# Patient Record
Sex: Female | Born: 1980 | Race: White | Hispanic: No | Smoking: Never smoker
Health system: Southern US, Community
[De-identification: ages and names within clinical notes are randomized; demographics above are authoritative.]

## PROBLEM LIST (undated history)

## (undated) DIAGNOSIS — F329 Major depressive disorder, single episode, unspecified: Secondary | ICD-10-CM

## (undated) DIAGNOSIS — E669 Obesity, unspecified: Secondary | ICD-10-CM

## (undated) DIAGNOSIS — E785 Hyperlipidemia, unspecified: Secondary | ICD-10-CM

## (undated) DIAGNOSIS — F419 Anxiety disorder, unspecified: Secondary | ICD-10-CM

## (undated) DIAGNOSIS — R87619 Unspecified abnormal cytological findings in specimens from cervix uteri: Secondary | ICD-10-CM

## (undated) DIAGNOSIS — G35 Multiple sclerosis: Secondary | ICD-10-CM

## (undated) DIAGNOSIS — F32A Depression, unspecified: Secondary | ICD-10-CM

## (undated) HISTORY — DX: Depression, unspecified: F32.A

## (undated) HISTORY — DX: Anxiety disorder, unspecified: F41.9

## (undated) HISTORY — DX: Obesity, unspecified: E66.9

## (undated) HISTORY — DX: Major depressive disorder, single episode, unspecified: F32.9

## (undated) HISTORY — DX: Multiple sclerosis: G35

## (undated) HISTORY — DX: Unspecified abnormal cytological findings in specimens from cervix uteri: R87.619

## (undated) HISTORY — PX: BREAST BIOPSY: SHX20

## (undated) HISTORY — DX: Hyperlipidemia, unspecified: E78.5

## (undated) HISTORY — PX: COLPOSCOPY: SHX161

---
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
## 2010-11-16 DIAGNOSIS — G35 Multiple sclerosis: Secondary | ICD-10-CM

## 2010-11-16 HISTORY — DX: Multiple sclerosis: G35

## 2014-09-26 ENCOUNTER — Encounter: Payer: Self-pay | Admitting: Family Medicine

## 2014-09-26 ENCOUNTER — Telehealth: Payer: Self-pay | Admitting: Family Medicine

## 2014-09-26 ENCOUNTER — Ambulatory Visit (INDEPENDENT_AMBULATORY_CARE_PROVIDER_SITE_OTHER): Payer: Managed Care, Other (non HMO) | Admitting: *Deleted

## 2014-09-26 ENCOUNTER — Ambulatory Visit (INDEPENDENT_AMBULATORY_CARE_PROVIDER_SITE_OTHER): Payer: Managed Care, Other (non HMO) | Admitting: Family Medicine

## 2014-09-26 VITALS — BP 123/90 | HR 80 | Temp 98.3°F | Ht 64.0 in | Wt 230.0 lb

## 2014-09-26 DIAGNOSIS — G35 Multiple sclerosis: Secondary | ICD-10-CM | POA: Insufficient documentation

## 2014-09-26 DIAGNOSIS — Z87898 Personal history of other specified conditions: Secondary | ICD-10-CM

## 2014-09-26 DIAGNOSIS — E785 Hyperlipidemia, unspecified: Secondary | ICD-10-CM

## 2014-09-26 DIAGNOSIS — Z23 Encounter for immunization: Secondary | ICD-10-CM

## 2014-09-26 DIAGNOSIS — F418 Other specified anxiety disorders: Secondary | ICD-10-CM

## 2014-09-26 DIAGNOSIS — Z0000 Encounter for general adult medical examination without abnormal findings: Secondary | ICD-10-CM

## 2014-09-26 MED ORDER — SERTRALINE HCL 100 MG PO TABS: 100.0000 mg | ORAL_TABLET | Freq: Every day | ORAL | Status: DC

## 2014-09-26 MED ORDER — DESOGESTREL-ETHINYL ESTRADIOL 0.15-30 MG-MCG PO TABS: 1.0000 | ORAL_TABLET | Freq: Every day | ORAL | Status: DC

## 2014-09-26 NOTE — Telephone Encounter (Signed)
Please call patient, I do not see where her lipid profile was collected during her office visit today. It is ordered, under future orders, and she can have this completed at her earliest convenience when she is fasting. Thanks.

## 2014-09-26 NOTE — Assessment & Plan Note (Addendum)
Patient is up-to-date on her Pap smear, due in 2016 with HPV Testing. Flu Shot administered today Tetanus shot 2014 Pneumonia vaccination 2014 Patient will benefit from early mammography with family history of breast cancer

## 2014-09-26 NOTE — Assessment & Plan Note (Signed)
Lipid profile collected today. Patient has a history of hyperlipidemia. She is not currently medicated.

## 2014-09-26 NOTE — Assessment & Plan Note (Signed)
Neurology referral placed for multiple sclerosis. Patient states that her multiple sclerosis started with some left eye optic neuritis. She follows with ophthalmology regularly. She is prescribed Gilenya 0.5 mg daily by her neurologist in [place]. She does have 2 refills left at this time. If she is unable to get into neurology before she is in need of refills, I will provide this for her just until she gets into neurology.

## 2014-09-26 NOTE — Assessment & Plan Note (Signed)
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 It is clipped for future reference on mammograms.

## 2014-09-26 NOTE — Telephone Encounter (Signed)
Pt had lipid panel at her last MD, and it was less than one year ago so her insurance will not cover it.  I have called and verified this with patient. Travas Schexnayder, Shelby RochesterJessica Dawn

## 2014-09-26 NOTE — Patient Instructions (Signed)
It was a pleasure meeting you today. We will refer you to a psychologist and a neurologist for multiple sclerosis. You will be due for your Pap in 2016. I will call in refills for you for your medications.

## 2014-09-26 NOTE — Progress Notes (Signed)
   Subjective:    Patient ID: Shelby [name], female    DOB: [birth year], 33 y.o.   MRN: [id number]  HPI  New patient establishment: patient presents to family medicine clinic today for new patient establishment. She moved to [place], from [place] in May 2015.  In [place] patients had a gynecologist, Psychiatrist and neurologist.  Patient's current medications are sertraline, trazodone, birth control pill, Gilenya.  Patient has no current complaints and ask for referrals for neurology and psychiatry.  Maintenance: patient is up-to-date with her Pap smear, next will be due in 2016 and will need an HPV completed at that time. Patient needs a flu shot.  Past Medical History  Diagnosis Date  . Depression   . Anxiety   . Multiple sclerosis 2012  . Hyperlipidemia    Past Surgical History  Procedure Laterality Date  . Wisdom tooth extraction N/A 2003    Allergies  Allergen Reactions  . Bactrim [Sulfamethoxazole-Trimethoprim] Anaphylaxis    Family history: Her mother suffered from bipolar/schizophrenia. Her mother also suffered an early death at age 42 from what was presumed to be a cardiac arrest. Her father died at age 7 from throat cancer. Her paternal and maternal grandmothers both had breast cancer. Her grandmother and aunt suffers from multiple sclerosis.  Social:She has lived with her female partner for 11 years.  She is a Museum/gallery exhibitions officer. Her last highest level of education was high school. She does not exercise regularly. She has never used tobacco. She is does not use alcohol or recreational drugs. She is not at risk for sexually transmitted diseases. She feels safe in her relationship. She does not feel depressed.   Review of Systems Per history of present illness    Objective:   Physical Exam BP 123/90 mmHg  Pulse 80  Temp(Src) 98.3 F (36.8 C) (Oral)  Ht 5\' 4"  (1.626 m)  Wt 230 lb (104.327 kg)  BMI 39.46  kg/m2  LMP 08/25/2014 (Approximate) Gen: very pleasant, Caucasian female, no acute distress, nontoxic appearance, well-developed, well-nourished. Obese. HEENT: AT. [place]. Bilateral TM visualized and normal in appearance. Bilateral eyes without injections or icterus. MMM. Bilateral nares without erythema or swelling. Throat without erythema or exudates.  CV: RRR no murmur Chest: CTAB, no wheeze or crackles Abd: Soft. round. NTND. BS present. no Masses palpated.  Ext: No erythema. No edema. 2/4 PT/DP Skin: no rashes, purpura or petechiae.  Neuro:  Normal gait. PERLA. EOMi. Alert. cranial nerves II through XII intact. DTRs equal bilaterally upper and lower extremity Msk: 5/5 bilaterally upper and lower extremity muscle strength Psych: normal affect, dress, demeanor and mood. Normal speech.      Assessment & Plan:

## 2014-09-26 NOTE — Assessment & Plan Note (Addendum)
Patient states she was taking trazodone mostly for sleep more than depression or anxiety and she does not feel it is working at this time. He does not request refills on this medication. She does ask for refills on sertraline, refills prescribed. I psychiatry referral placed, patient is being treated for depression with anxiety, however she has a family history of bipolar and schizophrenia in her mother.Shelby [name]

## 2014-09-27 ENCOUNTER — Other Ambulatory Visit: Payer: Managed Care, Other (non HMO)

## 2014-10-16 ENCOUNTER — Encounter: Payer: Self-pay | Admitting: Neurology

## 2014-10-16 ENCOUNTER — Ambulatory Visit (INDEPENDENT_AMBULATORY_CARE_PROVIDER_SITE_OTHER): Payer: Managed Care, Other (non HMO) | Admitting: Neurology

## 2014-10-16 VITALS — BP 128/78 | HR 81 | Ht 64.0 in | Wt 232.0 lb

## 2014-10-16 DIAGNOSIS — G35 Multiple sclerosis: Secondary | ICD-10-CM

## 2014-10-16 DIAGNOSIS — Z5181 Encounter for therapeutic drug level monitoring: Secondary | ICD-10-CM

## 2014-10-16 NOTE — Patient Instructions (Signed)

## 2014-10-16 NOTE — Progress Notes (Signed)
Reason for visit: Multiple sclerosis  Shelby [name] is a 33 y.o. female  History of present illness:  Shelby [name] is a 33 year old left-handed white female with a history of multiple sclerosis that was diagnosed in 2012. The presenting symptom was a significant left optic neuritis. The patient underwent MRI of the brain and was found to have multiple white matter lesions. The patient underwent a visual evoked response test and a brainstem auditory evoked response test. The patient never underwent a lumbar puncture, but she did have blood work. He was started on Gilenya, and she has been on Gilenya since 2012, with good tolerance. The patient occasionally will have some transient numbness of the right leg, but she has not sustained any significant permanent physical deficits from the multiple sclerosis. The vision in the left eye has essentially normalized. She denies any bowel or bladder control issues, and she denies any balance issues. She does not have weakness of the extremities. She is not having side effects from the Gilenya. She recently moved to this area from [place], and she is seeking another neurologist.  Past Medical History  Diagnosis Date  . Depression   . Anxiety   . Multiple sclerosis 2012  . Hyperlipidemia   . Obesity     Past Surgical History  Procedure Laterality Date  . Wisdom tooth extraction N/A 2003    Family History  Problem Relation Age of Onset  . Bipolar disorder Mother   . Schizophrenia Mother   . Early death Mother   . Heart attack Mother   . Throat cancer Father   . Early death Father   . Multiple sclerosis Maternal Aunt   . Multiple sclerosis Maternal Grandmother   . Cancer Maternal Grandmother   . Breast cancer Paternal Grandmother   . Multiple sclerosis Paternal Grandfather     Social history:  reports that she has never smoked. She has never used smokeless tobacco. She reports that she does not drink alcohol or use illicit  drugs.  Medications:  Current Outpatient Prescriptions on File Prior to Visit  Medication Sig Dispense Refill  . desogestrel-ethinyl estradiol (APRI,EMOQUETTE,SOLIA) 0.15-30 MG-MCG tablet Take 1 tablet by mouth daily. 1 Package 11  . Fingolimod HCl (GILENYA) 0.5 MG CAPS Take 0.5 mg by mouth daily.    . sertraline (ZOLOFT) 100 MG tablet Take 1 tablet (100 mg total) by mouth daily. 30 tablet 7  . traZODone (DESYREL) 50 MG tablet Take 50 mg by mouth at bedtime as needed for sleep.     No current facility-administered medications on file prior to visit.      Allergies  Allergen Reactions  . Bactrim [Sulfamethoxazole-Trimethoprim] Anaphylaxis    ROS:  Out of a complete 14 system review of symptoms, the patient complains only of the following symptoms, and all other reviewed systems are negative.  Fatigue Constipation Easy bruising Headache, numbness Insomnia, sleepiness  Blood pressure 128/78, pulse 81, height 5\' 4"  (1.626 m), weight 232 lb (105.235 kg), last menstrual period 08/25/2014.  Physical Exam  General: The patient is alert and cooperative at the time of the examination. The patient is markedly obese.  Eyes: Pupils are equal, round, and reactive to light. Discs are flat bilaterally.  Neck: The neck is supple, no carotid bruits are noted.  Respiratory: The respiratory examination is clear.  Cardiovascular: The cardiovascular examination reveals a regular rate and rhythm, no obvious murmurs or rubs are noted.  Skin: Extremities are without significant edema.  Neurologic Exam  Mental status: The patient is alert and oriented x 3 at the time of the examination. The patient has apparent normal recent and remote memory, with an apparently normal attention span and concentration ability.  Cranial nerves: Facial symmetry is present. There is good sensation of the face to pinprick and soft touch bilaterally. The strength of the facial muscles and the muscles to head  turning and shoulder shrug are normal bilaterally. Speech is well enunciated, no aphasia or dysarthria is noted. Extraocular movements are full. Visual fields are full. The tongue is midline, and the patient has symmetric elevation of the soft palate. No obvious hearing deficits are noted.  Motor: The motor testing reveals 5 over 5 strength of all 4 extremities. Good symmetric motor tone is noted throughout.  Sensory: Sensory testing is intact to pinprick, soft touch, vibration sensation, and position sense on all 4 extremities. No evidence of extinction is noted.  Coordination: Cerebellar testing reveals good finger-nose-finger and heel-to-shin bilaterally.  Gait and station: Gait is normal. Tandem gait is normal. Romberg is negative. No drift is seen.  Reflexes: Deep tendon reflexes are symmetric and normal bilaterally. Toes are downgoing bilaterally.   Assessment/Plan:  1. Multiple sclerosis  The patient will be sent for blood work for the Gilenya today, and she will have MRI evaluation of the brain with and without gadolinium enhancement. She will follow-up in 6 months. The patient will contact our office if new issues arise. So far, the patient appears to be doing quite well on Gilenya.  Marlan Palau. Keith Cathie Bonnell MD 10/16/2014 4:38 PM  [facility] [address] [place], [place] [id number]  Phone [phone] Fax [phone]

## 2014-10-17 LAB — COMPREHENSIVE METABOLIC PANEL
ALK PHOS: 100 IU/L (ref 39–117)
ALT: 12 IU/L (ref 0–32)
AST: 12 IU/L (ref 0–40)
Albumin/Globulin Ratio: 1.7 (ref 1.1–2.5)
Albumin: 4.2 g/dL (ref 3.5–5.5)
BUN / CREAT RATIO: 17 (ref 8–20)
BUN: 13 mg/dL (ref 6–20)
CALCIUM: 9.5 mg/dL (ref 8.7–10.2)
CO2: 23 mmol/L (ref 18–29)
CREATININE: 0.75 mg/dL (ref 0.57–1.00)
Chloride: 99 mmol/L (ref 97–108)
GFR calc Af Amer: 123 mL/min/{1.73_m2} (ref >59)
GFR calc non Af Amer: 107 mL/min/{1.73_m2} (ref >59)
GLOBULIN, TOTAL: 2.5 g/dL (ref 1.5–4.5)
Glucose: 94 mg/dL (ref 65–99)
Potassium: 4.5 mmol/L (ref 3.5–5.2)
Sodium: 136 mmol/L (ref 134–144)
Total Bilirubin: 0.3 mg/dL (ref 0.0–1.2)
Total Protein: 6.7 g/dL (ref 6.0–8.5)

## 2014-10-17 LAB — CBC WITH DIFFERENTIAL
BASOS: 0 %
Basophils Absolute: 0 10*3/uL (ref 0.0–0.2)
EOS: 1 %
Eosinophils Absolute: 0 10*3/uL (ref 0.0–0.4)
HCT: 39 % (ref 34.0–46.6)
HEMOGLOBIN: 13 g/dL (ref 11.1–15.9)
Immature Grans (Abs): 0 10*3/uL (ref 0.0–0.1)
Immature Granulocytes: 0 %
LYMPHS ABS: 0.4 10*3/uL — AB (ref 0.7–3.1)
Lymphs: 8 %
MCH: 28.2 pg (ref 26.6–33.0)
MCHC: 33.3 g/dL (ref 31.5–35.7)
MCV: 85 fL (ref 79–97)
MONOS ABS: 0.4 10*3/uL (ref 0.1–0.9)
Monocytes: 7 %
Neutrophils Absolute: 4.1 10*3/uL (ref 1.4–7.0)
Neutrophils Relative %: 84 %
Platelets: 305 10*3/uL (ref 150–379)
RBC: 4.61 x10E6/uL (ref 3.77–5.28)
RDW: 13.2 % (ref 12.3–15.4)
WBC: 5 10*3/uL (ref 3.4–10.8)

## 2014-10-25 ENCOUNTER — Ambulatory Visit (INDEPENDENT_AMBULATORY_CARE_PROVIDER_SITE_OTHER): Payer: Managed Care, Other (non HMO)

## 2014-10-25 DIAGNOSIS — G35 Multiple sclerosis: Secondary | ICD-10-CM

## 2014-10-25 MED ORDER — GADOPENTETATE DIMEGLUMINE 469.01 MG/ML IV SOLN: 20.0000 mL | Freq: Once | INTRAVENOUS | Status: AC | PRN

## 2014-10-26 ENCOUNTER — Telehealth: Payer: Self-pay | Admitting: Neurology

## 2014-10-26 NOTE — Telephone Encounter (Signed)
  I called the patient. The MRI shows MS lesions without enhancing lesions. No comparison study. The patient will continue on the Gilenya.  MRI brain 10/26/14:  IMPRESSION:  Abnormal MRI brain (with and without) demonstrating: 1. Multiple round and ovoid, periventricular, subcortical, peri-callosal and right cerebellar chronic demyelinating plaques.  2. No abnormal lesions are seen on post contrast views.

## 2015-04-18 ENCOUNTER — Ambulatory Visit: Payer: Managed Care, Other (non HMO) | Admitting: Nurse Practitioner

## 2015-04-18 ENCOUNTER — Encounter: Payer: Self-pay | Admitting: Nurse Practitioner

## 2015-04-18 ENCOUNTER — Ambulatory Visit (INDEPENDENT_AMBULATORY_CARE_PROVIDER_SITE_OTHER): Payer: Managed Care, Other (non HMO) | Admitting: Nurse Practitioner

## 2015-04-18 VITALS — BP 130/84 | HR 87 | Ht 64.0 in | Wt 230.6 lb

## 2015-04-18 DIAGNOSIS — G35 Multiple sclerosis: Secondary | ICD-10-CM | POA: Diagnosis not present

## 2015-04-18 DIAGNOSIS — G4710 Hypersomnia, unspecified: Secondary | ICD-10-CM

## 2015-04-18 DIAGNOSIS — Z5181 Encounter for therapeutic drug level monitoring: Secondary | ICD-10-CM

## 2015-04-18 DIAGNOSIS — R400 Somnolence: Secondary | ICD-10-CM | POA: Insufficient documentation

## 2015-04-18 NOTE — Progress Notes (Signed)
I have read the note, and I agree with the clinical assessment and plan.  WILLIS,CHARLES KEITH   

## 2015-04-18 NOTE — Patient Instructions (Signed)
Continue Gilenya at current dose Will set up for sleep study Will get labs to monitor adverse effects of Gilenya Follow-up in 6 months

## 2015-04-18 NOTE — Progress Notes (Signed)
[facility]  PATIENT: Shelby [name] DOB: [birth year]   REASON FOR VISIT: Follow-up for multiple sclerosis, new complaint of daytime drowsiness HISTORY FROM: Patient    HISTORY OF PRESENT ILLNESS: HISTORY: Shelby [name] is a 34 year old left-handed white female with a history of multiple sclerosis that was diagnosed in 2012. The presenting symptom was a significant left optic neuritis. The patient underwent MRI of the brain and was found to have multiple white matter lesions. The patient underwent a visual evoked response test and a brainstem auditory evoked response test. The patient never underwent a lumbar puncture, but she did have blood work. He was started on Gilenya, and she has been on Gilenya since 2012, with good tolerance. The patient occasionally will have some transient numbness of the right leg, but she has not sustained any significant permanent physical deficits from the multiple sclerosis. The vision in the left eye has essentially normalized. She denies any bowel or bladder control issues, and she denies any balance issues. She does not have weakness of the extremities. She is not having side effects from the Gilenya. She recently moved to this area from [place], and she is seeking another neurologist. UPDATE 04/18/15 Shelby [name], 34 year old female returns for follow-up. She has history of multiple sclerosis and was last seen in this office 10/16/2014 by Dr. Anne Hahn to establish a neurologist in [place]. She moved here from [place]. She has had MS since 2012. She is currently on Gilenya tolerating the medication without side effects. She denies any balance issues weakness of the extremities,  bladder or bowel difficulty. She denies any visual disturbance speech or swallowing problems. She does complain of daytime sleepiness and not feeling rested after sleeping at night. She denies morning headache. She returns for reevaluation. MRI brain 10/26/14:  IMPRESSION:  Abnormal MRI brain (with and without) demonstrating:1. Multiple round and ovoid, periventricular, subcortical, peri-callosal and right cerebellar chronic demyelinating plaques.   No abnormal lesions are seen on post contrast views.  REVIEW OF SYSTEMS: Full 14 system review of systems performed and notable only for those listed, all others are neg:  Constitutional: neg  Cardiovascular: neg Ear/Nose/Throat: neg  Skin: neg Eyes: neg Respiratory: neg Gastroitestinal: neg  Hematology/Lymphatic: neg  Endocrine: neg Musculoskeletal: Joint pain  Allergy/Immunology: neg Neurological: Occasional headache Psychiatric: neg Sleep : Daytime sleepiness, frequent awakening   ALLERGIES: Allergies  Allergen Reactions  . Bactrim [Sulfamethoxazole-Trimethoprim] Anaphylaxis    HOME MEDICATIONS: Outpatient Prescriptions Prior to Visit  Medication Sig Dispense Refill  . desogestrel-ethinyl estradiol (APRI,EMOQUETTE,SOLIA) 0.15-30 MG-MCG tablet Take 1 tablet by mouth daily. 1 Package 11  . Fingolimod HCl (GILENYA) 0.5 MG CAPS Take 0.5 mg by mouth daily.    . sertraline (ZOLOFT) 100 MG tablet Take 1 tablet (100 mg total) by mouth daily. 30 tablet 7  . traZODone (DESYREL) 50 MG tablet Take 50 mg by mouth at bedtime as needed for sleep.     No facility-administered medications prior to visit.    PAST MEDICAL HISTORY: Past Medical History  Diagnosis Date  . Depression   . Anxiety   . Multiple sclerosis 2012  . Hyperlipidemia   . Obesity     PAST SURGICAL HISTORY: Past Surgical History  Procedure Laterality Date  . Wisdom tooth extraction N/A 2003    FAMILY HISTORY: Family History  Problem Relation Age of Onset  . Bipolar disorder Mother   . Schizophrenia Mother   . Early death Mother   . Heart attack Mother   . Throat  cancer Father   . Early death Father   . Multiple sclerosis Maternal Aunt   . Multiple sclerosis Maternal Grandmother   . Cancer Maternal Grandmother    . Breast cancer Paternal Grandmother   . Multiple sclerosis Paternal Grandfather     SOCIAL HISTORY: History   Social History  . Marital Status: Married    Spouse Name: N/A  . Number of Children: 0  . Years of Education: HS   Occupational History  . marketing    Social History Main Topics  . Smoking status: Never Smoker   . Smokeless tobacco: Never Used  . Alcohol Use: 0.0 oz/week    0 Standard drinks or equivalent per week     Comment: rare  . Drug Use: No  . Sexual Activity: Yes    Birth Control/ Protection: Pill   Other Topics Concern  . Not on file   Social History Narrative   Patient is left handed.   Patient lives at home with significant other   Patient drinks a lot of caffeine.     PHYSICAL EXAM  Filed Vitals:   04/18/15 0928  BP: 130/84  Pulse: 87  Height:  (1.626 m)  Weight: 230 lb 9.6 oz (104.599 kg)   Body mass index is 39.56 kg/(m^2). General: The patient is alert and cooperative at the time of the examination. The patient is markedly obese. Neck: The neck is supple, no carotid bruits are noted. Neck size 15 [place] 3 Respiratory: The respiratory examination is clear. Cardiovascular: The cardiovascular examination reveals a regular rate and rhythm, no obvious murmurs or rubs are noted. Skin: Extremities are without significant edema.  Neurologic Exam  Mental status: The patient is alert and oriented x 3 at the time of the examination. The patient has apparent normal recent and remote memory, with  normal attention span and concentration ability.ESS 12, FSS 42 Cranial nerves: Pupils are equal, round, and reactive to light. Discs are flat bilaterally.Extraocular movements are full. Visual fields are full. Facial symmetry is present. There is good sensation of the face to pinprick and soft touch bilaterally. The strength of the facial muscles and the muscles to head turning and shoulder shrug are normal bilaterally. Speech is well enunciated,  no aphasia or dysarthria is noted.  The tongue is midline, and the patient has symmetric elevation of the soft palate. No obvious hearing deficits are noted. Motor: The motor testing reveals 5 over 5 strength of all 4 extremities. Good symmetric motor tone is noted throughout. Sensory: Sensory testing is intact to pinprick, soft touch, vibration sensation, and position sense on all 4 extremities. No evidence of extinction is noted. Coordination: Cerebellar testing reveals good finger-nose-finger and heel-to-shin bilaterally. Gait and station: Gait is normal. Tandem gait is normal. Romberg is negative. No drift is seen. Reflexes: Deep tendon reflexes are symmetric and normal bilaterally. Toes are downgoing bilaterally.   DIAGNOSTIC DATA (LABS, IMAGING, TESTING) - I reviewed patient records, labs, notes, testing and imaging myself where available.  Lab Results  Component Value Date   WBC 5.0 10/16/2014   HGB 13.0 10/16/2014   HCT 39.0 10/16/2014   MCV 85 10/16/2014   PLT 305 10/16/2014      Component Value Date/Time   NA 136 10/16/2014 1256   K 4.5 10/16/2014 1256   CL 99 10/16/2014 1256   CO2 23 10/16/2014 1256   GLUCOSE 94 10/16/2014 1256   BUN 13 10/16/2014 1256   CREATININE 0.75 10/16/2014 1256   CALCIUM  9.5 10/16/2014 1256   PROT 6.7 10/16/2014 1256   AST 12 10/16/2014 1256   ALT 12 10/16/2014 1256   ALKPHOS 100 10/16/2014 1256   BILITOT 0.3 10/16/2014 1256   GFRNONAA 107 10/16/2014 1256   GFRAA 123 10/16/2014 1256    ASSESSMENT AND PLAN  34 y.o. year old female  has a past medical history of Depression; Anxiety; Multiple sclerosis (2012); Hyperlipidemia; and Obesity. here to follow-up. She has a new complaint of daytime drowsiness. Most recent MRI brain 10/26/14: IMPRESSION:  Abnormal MRI brain (with and without) demonstrating: 1. Multiple round and ovoid, periventricular, subcortical, peri-callosal and right cerebellar chronic demyelinating plaques. 2. No abnormal  lesions are seen on post contrast views.  Continue Gilenya at current dose Will set up for sleep study Will get labs to monitor adverse effects of Gilenya Follow-up in 6 months Nilda Riggs, [facility], [facility], APRN  [facility] [address] [place], [place] [address] [phone]

## 2015-04-19 ENCOUNTER — Telehealth: Payer: Self-pay | Admitting: *Deleted

## 2015-04-19 LAB — COMPREHENSIVE METABOLIC PANEL
A/G RATIO: 1.7 (ref 1.1–2.5)
ALBUMIN: 4.3 g/dL (ref 3.5–5.5)
ALT: 14 IU/L (ref 0–32)
AST: 15 IU/L (ref 0–40)
Alkaline Phosphatase: 108 IU/L (ref 39–117)
BILIRUBIN TOTAL: 0.3 mg/dL (ref 0.0–1.2)
BUN/Creatinine Ratio: 17 (ref 8–20)
BUN: 12 mg/dL (ref 6–20)
CHLORIDE: 102 mmol/L (ref 97–108)
CO2: 24 mmol/L (ref 18–29)
CREATININE: 0.71 mg/dL (ref 0.57–1.00)
Calcium: 9.5 mg/dL (ref 8.7–10.2)
GFR calc non Af Amer: 114 mL/min/{1.73_m2} (ref >59)
GFR, EST AFRICAN AMERICAN: 131 mL/min/{1.73_m2} (ref >59)
GLOBULIN, TOTAL: 2.5 g/dL (ref 1.5–4.5)
GLUCOSE: 96 mg/dL (ref 65–99)
Potassium: 5.1 mmol/L (ref 3.5–5.2)
Sodium: 142 mmol/L (ref 134–144)
Total Protein: 6.8 g/dL (ref 6.0–8.5)

## 2015-04-19 LAB — CBC WITH DIFFERENTIAL/PLATELET
BASOS ABS: 0 10*3/uL (ref 0.0–0.2)
BASOS: 1 %
EOS (ABSOLUTE): 0 10*3/uL (ref 0.0–0.4)
Eos: 1 %
HEMATOCRIT: 39.7 % (ref 34.0–46.6)
HEMOGLOBIN: 12.7 g/dL (ref 11.1–15.9)
Immature Grans (Abs): 0.1 10*3/uL (ref 0.0–0.1)
Immature Granulocytes: 1 %
LYMPHS: 15 %
Lymphocytes Absolute: 0.6 10*3/uL — ABNORMAL LOW (ref 0.7–3.1)
MCH: 27.6 pg (ref 26.6–33.0)
MCHC: 32 g/dL (ref 31.5–35.7)
MCV: 86 fL (ref 79–97)
MONOCYTES: 8 %
MONOS ABS: 0.3 10*3/uL (ref 0.1–0.9)
NEUTROS ABS: 3 10*3/uL (ref 1.4–7.0)
Neutrophils: 74 %
PLATELETS: 308 10*3/uL (ref 150–379)
RBC: 4.6 x10E6/uL (ref 3.77–5.28)
RDW: 12.9 % (ref 12.3–15.4)
WBC: 4 10*3/uL (ref 3.4–10.8)

## 2015-04-19 NOTE — Telephone Encounter (Signed)
I called and LMVM home # to return call for lab results.  ( labs are stable).

## 2015-04-19 NOTE — Telephone Encounter (Signed)
-----   Message from Nilda Riggs, NP sent at 04/19/2015  9:01 AM EDT ----- Labs are stable please call the patient

## 2015-04-22 ENCOUNTER — Encounter: Payer: Self-pay | Admitting: *Deleted

## 2015-04-22 NOTE — Telephone Encounter (Signed)
I called pt and did not leave another message.   Will send letter with lab results.

## 2015-04-22 NOTE — Telephone Encounter (Signed)
I spoke to pt and gave her her lab results as stable.   She will get copy in mail as well.

## 2015-04-22 NOTE — Progress Notes (Signed)
Quick Note:  I called pt again and was not able to connect by phone. Did not LM 2nd time. Mailed copy of lab results to pts address. ______

## 2015-04-22 NOTE — Telephone Encounter (Signed)
Patient returned call and requested to speak with Shelby [name].

## 2015-05-15 ENCOUNTER — Encounter: Payer: Self-pay | Admitting: Neurology

## 2015-05-15 ENCOUNTER — Ambulatory Visit (INDEPENDENT_AMBULATORY_CARE_PROVIDER_SITE_OTHER): Payer: Managed Care, Other (non HMO) | Admitting: Neurology

## 2015-05-15 ENCOUNTER — Encounter (INDEPENDENT_AMBULATORY_CARE_PROVIDER_SITE_OTHER): Payer: Self-pay

## 2015-05-15 VITALS — BP 122/88 | HR 80 | Resp 16 | Ht 64.0 in | Wt 228.0 lb

## 2015-05-15 DIAGNOSIS — E669 Obesity, unspecified: Secondary | ICD-10-CM

## 2015-05-15 DIAGNOSIS — R351 Nocturia: Secondary | ICD-10-CM | POA: Diagnosis not present

## 2015-05-15 DIAGNOSIS — G4719 Other hypersomnia: Secondary | ICD-10-CM | POA: Diagnosis not present

## 2015-05-15 DIAGNOSIS — G479 Sleep disorder, unspecified: Secondary | ICD-10-CM

## 2015-05-15 NOTE — Patient Instructions (Addendum)
Please remember to try to maintain good sleep hygiene, which means: Keep a regular sleep and wake schedule, try not to exercise or have a meal within 2 hours of your bedtime, try to keep your bedroom conducive for sleep, that is, cool and dark, without light distractors such as an illuminated alarm clock, and refrain from watching TV right before sleep or in the middle of the night and do not keep the TV or radio on during the night. Also, try not to use or play on electronic devices at bedtime, such as your cell phone, tablet PC or laptop. If you like to read at bedtime on an electronic device, try to dim the background light as much as possible. Do not eat in the middle of the night.   Try to reduce your caffeine intake and increase your water intake. Avoid caffeine after 5 PM.   We will do another sleep study and call you with the results. I may see you back after the test as well, depending on what it showed.   We will call you after your sleep study to advise about the results (most likely, you will hear from Lafonda Mosses, my nurse) and to set up an appointment at the time, as necessary.    Our sleep lab administrative assistant, Alvis Lemmings will meet with you or call you to schedule your sleep study. If you don't hear back from her by next week please feel free to call her at [phone]. This is her direct line and please leave a message with your phone number to call back if you get the voicemail box. She will call back as soon as possible.

## 2015-05-15 NOTE — Progress Notes (Signed)
Subjective:    Patient ID: Shelby [name] is a 34 y.o. female.  HPI     Huston Foley, MD, PhD [facility] [address], [address] [address] [place], [place] [address]  Dear Eber Jones and Mellody Dance,  I saw your patient, Marit Goodwill, upon your kind request in my clinic today for initial consultation of her sleep disorder, in particular, concern for underlying obstructive sleep apnea. The patient is unaccompanied today. As you know, Ms. Montrose is a 34 year old right-handed woman with an underlying medical history of multiple sclerosis, hyperlipidemia, anxiety, depression, and obesity, who reports excessive daytime somnolence, nonrestorative sleep, and multiple night time awakenings. She moved from [place] about a year ago. She was diagnosed with multiple sclerosis in 2012. About 2 years ago she had a home sleep test and an overnight sleep study and per her verbal report she did not have any obstructive sleep apnea. Prior test results are not available for my review today. She is requesting records to be sent here. She has trouble falling asleep. This is a long-standing problem. In the past she tried trazodone up to 150 mg but it did not help. She has tried over-the-counter medications including melatonin which did not help. She does watch TV while in bed. Sometimes the TV stays on all night. She tries to put it on a timer. She drinks quite a bit of caffeine. She drinks 2 cups of tea in the morning, one or 2 cans of soda during the day and sweet tea in the afternoons, as late as 8 PM. She has to get up to use the bathroom once per night. She denies morning headaches, restless leg syndrome type symptoms and leg twitching at night. She is a restless sleeper however. She has been told by her husband that she grinds her teeth. She has no family history of obstructive sleep apnea. She works from home. Her company is based out of [place]. She has a bedtime of around midnight and it  may take her 2 hours to fall asleep. Her rise time is usually 10 AM. She does not wake up rested. Her Epworth sleepiness score is 14 out of 24 today. Fatigue score is 39 out of 63. She has maintained her weight of the past few years. She drinks alcohol very rarely. She does not smoke cigarettes. She has smoked marijuana in the past. She felt it helped her sleep and relax. Ambien in the past caused too much daytime grogginess the next day.   Her Past Medical History Is Significant For: Past Medical History  Diagnosis Date  . Depression   . Anxiety   . Multiple sclerosis 2012  . Hyperlipidemia   . Obesity     Her Past Surgical History Is Significant For: Past Surgical History  Procedure Laterality Date  . Wisdom tooth extraction N/A 2003    Her Family History Is Significant For: Family History  Problem Relation Age of Onset  . Bipolar disorder Mother   . Schizophrenia Mother   . Early death Mother   . Heart attack Mother   . Throat cancer Father   . Early death Father   . Multiple sclerosis Maternal Aunt   . Multiple sclerosis Maternal Grandmother   . Cancer Maternal Grandmother   . Breast cancer Paternal Grandmother   . Multiple sclerosis Paternal Grandfather     Her Social History Is Significant For: History   Social History  . Marital Status: Married    Spouse Name: N/A  .  Number of Children: 0  . Years of Education: HS   Occupational History  . marketing    Social History Main Topics  . Smoking status: Never Smoker   . Smokeless tobacco: Never Used  . Alcohol Use: 0.0 oz/week    0 Standard drinks or equivalent per week     Comment: rare  . Drug Use: No  . Sexual Activity: Yes    Birth Control/ Protection: Pill   Other Topics Concern  . None   Social History Narrative   Patient is left handed.   Patient lives at home with significant other   Patient drinks coffee, tea, soda daily     Her Allergies Are:  Allergies  Allergen Reactions  . Bactrim  [Sulfamethoxazole-Trimethoprim] Anaphylaxis  :   Her Current Medications Are:  Outpatient Encounter Prescriptions as of 05/15/2015  Medication Sig  . desogestrel-ethinyl estradiol (APRI,EMOQUETTE,SOLIA) 0.15-30 MG-MCG tablet Take 1 tablet by mouth daily.  . Fingolimod HCl (GILENYA) 0.5 MG CAPS Take 0.5 mg by mouth daily.  Marland Kitchen glucosamine-chondroitin 500-400 MG tablet Take 1 tablet by mouth daily.  . sertraline (ZOLOFT) 100 MG tablet Take 1 tablet (100 mg total) by mouth daily.  . traZODone (DESYREL) 50 MG tablet Take 50 mg by mouth at bedtime as needed for sleep.   No facility-administered encounter medications on file as of 05/15/2015.  :  Review of Systems:  Out of a complete 14 point review of systems, all are reviewed and negative with the exception of these symptoms as listed below:  Review of Systems  Constitutional: Positive for fatigue.  Cardiovascular: Positive for leg swelling.  Musculoskeletal:       Joint pain   Neurological:       Trouble falling and staying asleep, no snoring reported, no choking at night, wakes up feeling tired in the morning, sometimes takes naps during day, daytime tiredness.   Hematological: Bruises/bleeds easily.  Psychiatric/Behavioral:       Anxiety, not enough sleep, decreased energy    Objective:  Neurologic Exam  Physical Exam Physical Examination:   Filed Vitals:   05/15/15 1324  BP: 122/88  Pulse: 80  Resp: 16    General Examination: The patient is a very pleasant 34 y.o. female in no acute distress. She appears well-developed and well-nourished and well groomed. She is obese.  HEENT: Normocephalic, atraumatic, pupils are equal, round and reactive to light and accommodation. Funduscopic exam is normal with sharp disc margins noted. Extraocular tracking is good without limitation to gaze excursion or nystagmus noted. Normal smooth pursuit is noted. Hearing is grossly intact. Tympanic membranes are clear bilaterally. Face is symmetric  with normal facial animation and normal facial sensation. Speech is clear with no dysarthria noted. There is no hypophonia. There is no lip, neck/head, jaw or voice tremor. Neck is supple with full range of passive and active motion. There are no carotid bruits on auscultation. Oropharynx exam reveals: mild mouth dryness, good dental hygiene and moderate airway crowding, due to narrow airway entry, tonsils of 1-2+, right side bigger than left, redundant soft palate and mildly enlarged tongue. Mallampati is class II. Tongue protrudes centrally and palate elevates symmetrically. neck size is 14 1/8 inches. She has a Mild overbite.   Chest: Clear to auscultation without wheezing, rhonchi or crackles noted.  Heart: S1+S2+0, regular and normal without murmurs, rubs or gallops noted.   Abdomen: Soft, non-tender and non-distended with normal bowel sounds appreciated on auscultation.  Extremities: There is no pitting edema in  the distal lower extremities bilaterally. Pedal pulses are intact.  Skin: Warm and dry without trophic changes noted. There are no varicose veins.  Musculoskeletal: exam reveals no obvious joint deformities, tenderness or joint swelling or erythema.   Neurologically:  Mental status: The patient is awake, alert and oriented in all 4 spheres. Her immediate and remote memory, attention, language skills and fund of knowledge are appropriate. There is no evidence of aphasia, agnosia, apraxia or anomia. Speech is clear with normal prosody and enunciation. Thought process is linear. Mood is normal and affect is normal.  Cranial nerves II - XII are as described above under HEENT exam. In addition: shoulder shrug is normal with equal shoulder height noted. Motor exam: Normal bulk, strength and tone is noted. There is no drift, tremor or rebound. Romberg is negative. Reflexes are 2+ throughout. Babinski: Toes are flexor bilaterally. Fine motor skills and coordination: intact with normal finger  taps, normal hand movements, normal rapid alternating patting, normal foot taps and normal foot agility.  Cerebellar testing: No dysmetria or intention tremor on finger to nose testing. Heel to shin is unremarkable bilaterally. There is no truncal or gait ataxia.  Sensory exam: intact to light touch, pinprick, vibration, temperature sense in the upper and lower extremities.  Gait, station and balance: She stands easily. No veering to one side is noted. No leaning to one side is noted. Posture is age-appropriate and stance is narrow based. Gait shows normal stride length and normal pace. No problems turning are noted. She turns en bloc. Tandem walk is unremarkable.   Assessment and Plan:   In summary, Nandini Bogdanski is a very pleasant 34 y.o.-year old female with an underlying medical history of multiple sclerosis, hyperlipidemia, anxiety, depression, and obesity, who reports excessive daytime somnolence, nonrestorative sleep, and multiple night time awakenings. While she does not give telltale symptoms of obstructive sleep apnea. She is at risk for OSA given her crowded looking airway, her obesity, her daytime somnolence and nocturia reported.  I had a long chat with the patient about my findings and the diagnosis of OSA, its prognosis and treatment options. We talked about medical treatments, surgical interventions and non-pharmacological approaches. I explained in particular the risks and ramifications of untreated moderate to severe OSA, especially with respect to developing cardiovascular disease down the Road, including congestive heart failure, difficult to treat hypertension, cardiac arrhythmias, or stroke. Even type 2 diabetes has, in part, been linked to untreated OSA. Symptoms of untreated OSA include daytime sleepiness, memory problems, mood irritability and mood disorder such as depression and anxiety, lack of energy, as well as recurrent headaches, especially morning headaches. We talked  about trying to maintain a healthy lifestyle in general, as well as the importance of weight control. I encouraged the patient to eat healthy, exercise daily and keep well hydrated, to keep a scheduled bedtime and wake time routine, to not skip any meals and eat healthy snacks in between meals. I advised the patient not to drive when feeling sleepy. Furthermore, I advised her regarding good sleep hygiene. She is advised not to have the TV on in her bedroom. She is furthermore advised to reduce her caffeine intake and increase her water intake. She is advised to avoid caffeine after 5 PM. I recommended the following at this time: sleep study with potential positive airway pressure titration. (We will score hypopneas at 3% and split the sleep study into diagnostic and treatment portion, if the estimated. 2 hour AHI is >15/h, unless  mandated otherwise by her insurance).   I explained the sleep test procedure to the patient and also outlined possible surgical and non-surgical treatment options of OSA, including the use of a custom-made dental device (which would require a referral to a specialist dentist or oral surgeon), upper airway surgical options, such as pillar implants, radiofrequency surgery, tongue base surgery, and UPPP (which would involve a referral to an ENT surgeon). Rarely, jaw surgery such as mandibular advancement may be considered.  I also explained the CPAP treatment option to the patient, who indicated that she would be willing to try CPAP if the need arises. I explained the importance of being compliant with PAP treatment, not only for insurance purposes but primarily to improve Her symptoms, and for the patient's long term health benefit, including to reduce Her cardiovascular risks. I answered all her questions today and the patient was in agreement. I would like to see her back after the sleep study is completed and encouraged her to call with any interim questions, concerns, problems or  updates.   Thank you very much for allowing me to participate in the care of this nice patient. If I can be of any further assistance to you please do not hesitate to talk to me.   Sincerely,   Huston Foley, MD, PhD

## 2015-06-06 ENCOUNTER — Other Ambulatory Visit: Payer: Self-pay | Admitting: Family Medicine

## 2015-07-01 ENCOUNTER — Telehealth: Payer: Self-pay | Admitting: Neurology

## 2015-07-01 ENCOUNTER — Encounter (INDEPENDENT_AMBULATORY_CARE_PROVIDER_SITE_OTHER): Payer: Managed Care, Other (non HMO) | Admitting: Neurology

## 2015-07-01 ENCOUNTER — Other Ambulatory Visit: Payer: Self-pay

## 2015-07-01 DIAGNOSIS — R351 Nocturia: Secondary | ICD-10-CM

## 2015-07-01 DIAGNOSIS — R0683 Snoring: Secondary | ICD-10-CM

## 2015-07-01 DIAGNOSIS — G4710 Hypersomnia, unspecified: Secondary | ICD-10-CM | POA: Diagnosis not present

## 2015-07-01 DIAGNOSIS — G4719 Other hypersomnia: Secondary | ICD-10-CM

## 2015-07-01 DIAGNOSIS — E669 Obesity, unspecified: Secondary | ICD-10-CM

## 2015-07-01 DIAGNOSIS — G479 Sleep disorder, unspecified: Secondary | ICD-10-CM

## 2015-07-01 NOTE — Telephone Encounter (Signed)
I need the split order changed to a HST due to insurance denial

## 2015-07-01 NOTE — Telephone Encounter (Signed)
I have put the order in, let me know if I need to change anything

## 2015-07-01 NOTE — Telephone Encounter (Signed)
I received a copy of prior medical records on this patient. The patient has had visual, auditory, and cemented sensory evoked responses. The visual evoked response was abnormal laterally, the brainstem auditory evoked response test and somatosensory evoked response tests were normal. MRI the brain shows extensive white matter disease compatible with demyelinating disease. There are subcortical and periventricular and pericallosal lesions perpendicular to the ventricles these lesions are felt to be fully consistent with multiple sclerosis. Varicella-zoster IgG antibody was positive at 1.26 double stranded IgG antibody was elevated at 71.1 normal lab was greater than 30.0 vitamin B12 level was low normal at 213. Lyme antibody panel negative. Last MRI the brain done on 04/22/2012 showed bilateral abnormal white matter lesions, no enhancement was seen.

## 2015-07-08 ENCOUNTER — Telehealth: Payer: Self-pay | Admitting: Neurology

## 2015-07-08 NOTE — Telephone Encounter (Signed)
Dr. Anne Hahn' and Carolyn's patient, seen by me on 05/15/15, had HST on 07/02/15:   Please call and notify the patient that the recent home sleep test did not show any significant obstructive sleep apnea. Patient can follow up with the referring provider. A copy of the report will be sent to the patient, the PCP and referring MD, if other than PCP.  Once you have spoken to patient, you can close this encounter.   Thanks,  Huston Foley, MD, PhD [facility])

## 2015-07-09 ENCOUNTER — Telehealth: Payer: Self-pay | Admitting: Neurology

## 2015-07-09 NOTE — Telephone Encounter (Signed)
Duplicate task.

## 2015-07-09 NOTE — Telephone Encounter (Signed)
Patient called requesting results of home sleep study

## 2015-07-09 NOTE — Telephone Encounter (Signed)
Left message on vm with results and recommendation below. I will mail copy to patient and fax copy to PCP.

## 2015-08-08 ENCOUNTER — Telehealth: Payer: Self-pay

## 2015-08-08 NOTE — Telephone Encounter (Signed)
Shelby [name] has approved the request for continuation of coverage on Gilenya effective until 08/06/2016 Ref # [id number]

## 2015-08-29 ENCOUNTER — Other Ambulatory Visit: Payer: Self-pay | Admitting: *Deleted

## 2015-08-29 MED ORDER — DESOGESTREL-ETHINYL ESTRADIOL 0.15-30 MG-MCG PO TABS: 1.0000 | ORAL_TABLET | Freq: Every day | ORAL | Status: DC

## 2015-10-21 ENCOUNTER — Encounter: Payer: Self-pay | Admitting: Nurse Practitioner

## 2015-10-21 ENCOUNTER — Ambulatory Visit (INDEPENDENT_AMBULATORY_CARE_PROVIDER_SITE_OTHER): Payer: Managed Care, Other (non HMO) | Admitting: Nurse Practitioner

## 2015-10-21 VITALS — BP 126/81 | HR 80 | Ht 64.0 in | Wt 237.0 lb

## 2015-10-21 DIAGNOSIS — Z5181 Encounter for therapeutic drug level monitoring: Secondary | ICD-10-CM

## 2015-10-21 DIAGNOSIS — G35 Multiple sclerosis: Secondary | ICD-10-CM | POA: Diagnosis not present

## 2015-10-21 DIAGNOSIS — F418 Other specified anxiety disorders: Secondary | ICD-10-CM

## 2015-10-21 MED ORDER — FINGOLIMOD HCL 0.5 MG PO CAPS: 0.5000 mg | ORAL_CAPSULE | Freq: Every day | ORAL | Status: DC

## 2015-10-21 NOTE — Progress Notes (Signed)
[facility]  PATIENT: Shelby [name] DOB: Jul 25, 1981   REASON FOR VISIT: Follow-up for relapsing remitting multiple sclerosis, depression and fatigue HISTORY FROM: Patient    HISTORY OF PRESENT ILLNESSHISTORY: Shelby [name] is a 34 year old left-handed white female with a history of multiple sclerosis that was diagnosed in 2012. The presenting symptom was a significant left optic neuritis. The patient underwent MRI of the brain and was found to have multiple white matter lesions. The patient underwent a visual evoked response test and a brainstem auditory evoked response test. The patient never underwent a lumbar puncture, but she did have blood work. He was started on Gilenya, and she has been on Gilenya since 2012, with good tolerance. The patient occasionally will have some transient numbness of the right leg, but she has not sustained any significant permanent physical deficits from the multiple sclerosis. The vision in the left eye has essentially normalized. She denies any bowel or bladder control issues, and she denies any balance issues. She does not have weakness of the extremities. She is not having side effects from the Gilenya. She recently moved to this area from [place], and she is seeking another neurologist. UPDATE 10/21/15 Shelby [name], 34 year old female returns for follow-up. She has history of multiple sclerosis and was last seen in this office 04/18/15.  She moved here from [place] one and one half years ago. She has had MS since 2012. She is currently on Gilenya tolerating the medication without side effects. She denies any balance issues weakness of the extremities, bladder or bowel difficulty. She denies any visual disturbance speech or swallowing problems. She does complain of daytime sleepiness and not feeling rested after sleeping at night. She denies morning headache. She returns for reevaluation. Home sleep study was normal. She has no regular  exercise. She is on Zoloft for her depression. MRI brain 10/26/14: IMPRESSION:  Abnormal MRI brain (with and without) demonstrating:1. Multiple round and ovoid, periventricular, subcortical, peri-callosal and right cerebellar chronic demyelinating plaques.  No abnormal lesions are seen on post contrast views.:   REVIEW OF SYSTEMS: Full 14 system review of systems performed and notable only for those listed, all others are neg:  Constitutional: Fatigue Cardiovascular: neg Ear/Nose/Throat: neg  Skin: neg Eyes: neg Respiratory: neg Gastroitestinal: neg  Hematology/Lymphatic: neg  Endocrine: neg Musculoskeletal: Joint pain Allergy/Immunology: neg Neurological: neg Psychiatric: neg Sleep : neg   ALLERGIES: Allergies  Allergen Reactions  . Bactrim [Sulfamethoxazole-Trimethoprim] Anaphylaxis    HOME MEDICATIONS: Outpatient Prescriptions Prior to Visit  Medication Sig Dispense Refill  . desogestrel-ethinyl estradiol (APRI,EMOQUETTE,SOLIA) 0.15-30 MG-MCG tablet Take 1 tablet by mouth daily. 1 Package 11  . Fingolimod HCl (GILENYA) 0.5 MG CAPS Take 0.5 mg by mouth daily.    . sertraline (ZOLOFT) 100 MG tablet TAKE 1 TABLET (100 MG TOTAL) BY MOUTH DAILY. 30 tablet 5  . glucosamine-chondroitin 500-400 MG tablet Take 1 tablet by mouth daily.    . traZODone (DESYREL) 50 MG tablet Take 50 mg by mouth at bedtime as needed for sleep.     No facility-administered medications prior to visit.    PAST MEDICAL HISTORY: Past Medical History  Diagnosis Date  . Depression   . Anxiety   . Multiple sclerosis (HCC) 2012  . Hyperlipidemia   . Obesity     PAST SURGICAL HISTORY: Past Surgical History  Procedure Laterality Date  . Wisdom tooth extraction N/A 2003    FAMILY HISTORY: Family History  Problem Relation Age of Onset  . Bipolar disorder  Mother   . Schizophrenia Mother   . Early death Mother   . Heart attack Mother   . Throat cancer Father   . Early death Father   .  Multiple sclerosis Maternal Aunt   . Multiple sclerosis Maternal Grandmother   . Cancer Maternal Grandmother   . Breast cancer Paternal Grandmother   . Multiple sclerosis Paternal Grandfather     SOCIAL HISTORY: Social History   Social History  . Marital Status: Married    Spouse Name: N/A  . Number of Children: 0  . Years of Education: HS   Occupational History  . marketing    Social History Main Topics  . Smoking status: Never Smoker   . Smokeless tobacco: Never Used  . Alcohol Use: 0.0 oz/week    0 Standard drinks or equivalent per week     Comment: rare  . Drug Use: No  . Sexual Activity: Yes    Birth Control/ Protection: Pill   Other Topics Concern  . Not on file   Social History Narrative   Patient is left handed.   Patient lives at home with significant other   Patient drinks coffee, tea, soda daily      PHYSICAL EXAM  Filed Vitals:   10/21/15 1022  BP: 126/81  Pulse: 80  Height: 5\' 4"  (1.626 m)  Weight: 237 lb (107.502 kg)   Body mass index is 40.66 kg/(m^2). General: The patient is alert and cooperative at the time of the examination. The patient is markedly obese. Neck: The neck is supple, no carotid bruits are noted. Respiratory: The respiratory examination is clear. Cardiovascular: The cardiovascular examination reveals a regular rate and rhythm, no obvious murmurs or rubs are noted. Skin: Extremities are without significant edema.  Neurologic Exam  Mental status: The patient is alert and oriented x 3 at the time of the examination. The patient has apparent normal recent and remote memory, with normal attention span and concentration ability. Cranial nerves: Pupils are equal, round, and reactive to light. Discs are flat bilaterally.Extraocular movements are full. Visual fields are full. Facial symmetry is present. There is good sensation of the face to pinprick and soft touch bilaterally. The strength of the facial muscles and the muscles to head  turning and shoulder shrug are normal bilaterally. Speech is well enunciated, no aphasia or dysarthria is noted. The tongue is midline, and the patient has symmetric elevation of the soft palate. No obvious hearing deficits are noted. Motor: The motor testing reveals 5 over 5 strength of all 4 extremities. Good symmetric motor tone is noted throughout. Sensory: Sensory testing is intact to pinprick, soft touch, vibration sensation, and position sense on all 4 extremities. No evidence of extinction is noted. Coordination: Cerebellar testing reveals good finger-nose-finger and heel-to-shin bilaterally. Gait and station: Gait is normal. Tandem gait is mildly unsteady Romberg is negative. No drift is seen. Reflexes: Deep tendon reflexes are symmetric and normal bilaterally. Toes are downgoing bilaterally.  DIAGNOSTIC DATA (LABS, IMAGING, TESTING) - I reviewed patient records, labs, notes, testing and imaging myself where available.  Lab Results  Component Value Date   WBC 4.0 04/18/2015   HGB 13.0 10/16/2014   HCT 39.7 04/18/2015   MCV 85 10/16/2014   PLT 305 10/16/2014      Component Value Date/Time   NA 142 04/18/2015 1001   K 5.1 04/18/2015 1001   CL 102 04/18/2015 1001   CO2 24 04/18/2015 1001   GLUCOSE 96 04/18/2015 1001   BUN  12 04/18/2015 1001   CREATININE 0.71 04/18/2015 1001   CALCIUM 9.5 04/18/2015 1001   PROT 6.8 04/18/2015 1001   ALBUMIN 4.3 04/18/2015 1001   AST 15 04/18/2015 1001   ALT 14 04/18/2015 1001   ALKPHOS 108 04/18/2015 1001   BILITOT 0.3 04/18/2015 1001   BILITOT 0.3 10/16/2014 1256   GFRNONAA 114 04/18/2015 1001   GFRAA 131 04/18/2015 1001    ASSESSMENT AND PLAN  34 y.o. year old female  has a past medical history of Depression; Anxiety; Multiple sclerosis (HCC) (2012); Hyperlipidemia; and Obesity. here to follow-up for her multiple sclerosis. Her home sleep study after last visit was normal.  Continue Gilenya at current dose will refill Will check  labs today to monitor side effects if Gilenya Continue Zoloft for depression Check with insurance about Provigil for fatigue associated with MS Encourage walking for exercise and healthy diet for weight loss Follow-up in 6-8 months Nilda Riggs, [facility], [facility], APRN  [facility] [address] [place], [place] [address] [phone]

## 2015-10-21 NOTE — Progress Notes (Signed)
I have read the note, and I agree with the clinical assessment and plan.  Shelby [name],Shelby [name]   

## 2015-10-21 NOTE — Patient Instructions (Signed)
Continue Gilenya at current dose will refill Will check labs today to monitor side effects if Gilenya Check with insurance about Provigil for fatigue Follow-up in 6-8 months

## 2015-10-22 ENCOUNTER — Telehealth: Payer: Self-pay | Admitting: *Deleted

## 2015-10-22 LAB — CBC WITH DIFFERENTIAL/PLATELET
BASOS: 0 %
Basophils Absolute: 0 10*3/uL (ref 0.0–0.2)
EOS (ABSOLUTE): 0.1 10*3/uL (ref 0.0–0.4)
Eos: 2 %
HEMOGLOBIN: 12.4 g/dL (ref 11.1–15.9)
Hematocrit: 37.3 % (ref 34.0–46.6)
IMMATURE GRANS (ABS): 0.1 10*3/uL (ref 0.0–0.1)
Immature Granulocytes: 1 %
LYMPHS ABS: 0.5 10*3/uL — AB (ref 0.7–3.1)
LYMPHS: 11 %
MCH: 28.2 pg (ref 26.6–33.0)
MCHC: 33.2 g/dL (ref 31.5–35.7)
MCV: 85 fL (ref 79–97)
Monocytes Absolute: 0.4 10*3/uL (ref 0.1–0.9)
Monocytes: 8 %
NEUTROS ABS: 3.5 10*3/uL (ref 1.4–7.0)
Neutrophils: 78 %
PLATELETS: 299 10*3/uL (ref 150–379)
RBC: 4.4 x10E6/uL (ref 3.77–5.28)
RDW: 13.4 % (ref 12.3–15.4)
WBC: 4.5 10*3/uL (ref 3.4–10.8)

## 2015-10-22 NOTE — Telephone Encounter (Signed)
-----   Message from Nilda Riggs, NP sent at 10/22/2015  7:54 AM EST ----- Labs stable please call the patient

## 2015-10-22 NOTE — Telephone Encounter (Signed)
I spoke to pt and relayed that her labs were stable.   She had no questions.

## 2015-11-26 ENCOUNTER — Other Ambulatory Visit: Payer: Self-pay | Admitting: Family Medicine

## 2016-04-20 ENCOUNTER — Ambulatory Visit (INDEPENDENT_AMBULATORY_CARE_PROVIDER_SITE_OTHER): Payer: Managed Care, Other (non HMO) | Admitting: Nurse Practitioner

## 2016-04-20 ENCOUNTER — Encounter: Payer: Self-pay | Admitting: Nurse Practitioner

## 2016-04-20 VITALS — BP 149/87 | HR 76 | Resp 16 | Ht 64.0 in | Wt 216.0 lb

## 2016-04-20 DIAGNOSIS — F418 Other specified anxiety disorders: Secondary | ICD-10-CM

## 2016-04-20 DIAGNOSIS — Z5181 Encounter for therapeutic drug level monitoring: Secondary | ICD-10-CM | POA: Diagnosis not present

## 2016-04-20 DIAGNOSIS — M545 Low back pain: Secondary | ICD-10-CM

## 2016-04-20 DIAGNOSIS — M549 Dorsalgia, unspecified: Secondary | ICD-10-CM | POA: Insufficient documentation

## 2016-04-20 DIAGNOSIS — G35 Multiple sclerosis: Secondary | ICD-10-CM | POA: Diagnosis not present

## 2016-04-20 NOTE — Progress Notes (Signed)
I have read the note, and I agree with the clinical assessment and plan.  Vernella Niznik KEITH   

## 2016-04-20 NOTE — Progress Notes (Signed)
[facility]  PATIENT: Shelby [name] DOB: 1981/11/07   REASON FOR VISIT: Follow-up for multiple sclerosis, depression and anxiety, complaint of back pain HISTORY FROM: Patient    HISTORY OF PRESENT ILLNESS:ILLNESSHISTORY: Shelby [name] is a 35 year old left-handed white female with a history of multiple sclerosis that was diagnosed in 2012. The presenting symptom was a significant left optic neuritis. The patient underwent MRI of the brain and was found to have multiple white matter lesions. The patient underwent a visual evoked response test and a brainstem auditory evoked response test. The patient never underwent a lumbar puncture, but she did have blood work. He was started on Gilenya, and she has been on Gilenya since 2012, with good tolerance. The patient occasionally will have some transient numbness of the right leg, but she has not sustained any significant permanent physical deficits from the multiple sclerosis. The vision in the left eye has essentially normalized. She denies any bowel or bladder control issues, and she denies any balance issues. She does not have weakness of the extremities. She is not having side effects from the Gilenya. She recently moved to this area from [place], and she is seeking another neurologist. UPDATE 10/21/15 CMMs. Shelby [name], 35 year old female returns for follow-up. She has history of multiple sclerosis and was last seen in this office 04/18/15. She moved here from [place] one and one half years ago. She has had MS since 2012. She is currently on Gilenya tolerating the medication without side effects. She denies any balance issues weakness of the extremities, bladder or bowel difficulty. She denies any visual disturbance speech or swallowing problems. She does complain of daytime sleepiness and not feeling rested after sleeping at night. She denies morning headache. She returns for reevaluation. Home sleep study was normal. She  has no regular exercise. She is on Zoloft for her depression. MRI brain 10/26/14: IMPRESSION:  Abnormal MRI brain (with and without) demonstrating:1. Multiple round and ovoid, periventricular, subcortical, peri-callosal and right cerebellar chronic demyelinating plaques.  No abnormal lesions are seen on post contrast views.: UPDATE 06/05/2017CM Shelby [name], 35 year old female returns for follow-up. She has a history of multiple sclerosis and is currently on Gilenya without side effects. She denies any balance issues weakness of the extremities bladder or bowel difficulty. She has not had speech or swallowing difficulties. Recent home sleep study was normal. In addition she has new onset of lower back pain which has been going on for approximately 2 months. She is not aware of any apparent injury. She says Motrin and anti-inflammatories have not been helpful. She gets no regular exercise. She has not had imaging of her low back. She returns for reevaluation  REVIEW OF SYSTEMS: Full 14 system review of systems performed and notable only for those listed, all others are neg:  Constitutional: Fatigue  Cardiovascular: neg Ear/Nose/Throat: neg  Skin: neg Eyes: neg Respiratory: neg Gastroitestinal: neg  Hematology/Lymphatic: Easy bruising Endocrine: neg Musculoskeletal: Joint pain back pain Allergy/Immunology: neg Neurological: Occasional headache Psychiatric: neg Sleep : neg   ALLERGIES: Allergies  Allergen Reactions  . Bactrim [Sulfamethoxazole-Trimethoprim] Anaphylaxis    HOME MEDICATIONS: Outpatient Prescriptions Prior to Visit  Medication Sig Dispense Refill  . desogestrel-ethinyl estradiol (APRI,EMOQUETTE,SOLIA) 0.15-30 MG-MCG tablet Take 1 tablet by mouth daily. 1 Package 11  . Fingolimod HCl (GILENYA) 0.5 MG CAPS Take 1 capsule (0.5 mg total) by mouth daily. 90 capsule 2  . sertraline (ZOLOFT) 100 MG tablet TAKE 1 TABLET (100 MG TOTAL) BY MOUTH DAILY. 30 tablet 5  No  facility-administered medications prior to visit.    PAST MEDICAL HISTORY: Past Medical History  Diagnosis Date  . Depression   . Anxiety   . Multiple sclerosis (HCC) 2012  . Hyperlipidemia   . Obesity     PAST SURGICAL HISTORY: Past Surgical History  Procedure Laterality Date  . Wisdom tooth extraction N/A 2003    FAMILY HISTORY: Family History  Problem Relation Age of Onset  . Bipolar disorder Mother   . Schizophrenia Mother   . Early death Mother   . Heart attack Mother   . Throat cancer Father   . Early death Father   . Multiple sclerosis Maternal Aunt   . Multiple sclerosis Maternal Grandmother   . Cancer Maternal Grandmother   . Breast cancer Paternal Grandmother   . Multiple sclerosis Paternal Grandfather     SOCIAL HISTORY: Social History   Social History  . Marital Status: Married    Spouse Name: N/A  . Number of Children: 0  . Years of Education: HS   Occupational History  . marketing    Social History Main Topics  . Smoking status: Never Smoker   . Smokeless tobacco: Never Used  . Alcohol Use: 0.0 oz/week    0 Standard drinks or equivalent per week     Comment: rare  . Drug Use: No  . Sexual Activity: Yes    Birth Control/ Protection: Pill   Other Topics Concern  . Not on file   Social History Narrative   Patient is left handed.   Patient lives at home with significant other   Patient drinks coffee, tea, soda daily      PHYSICAL EXAM  Filed Vitals:   04/20/16 1050  BP: 149/87  Pulse: 76  Resp: 16  Height: 5\' 4"  (1.626 m)  Weight: 216 lb (97.977 kg)   Body mass index is 37.06 kg/(m^2). General: The patient is alert and cooperative at the time of the examination. The patient is markedly obese. Neck: The neck is supple, no carotid bruits are noted. Cardiovascular: The cardiovascular examination reveals a regular rate and rhythm, no obvious murmurs or rubs are noted. Skin: Extremities are without significant edema.  Neurologic  Exam  Mental status: The patient is alert and oriented x 3 at the time of the examination. The patient has apparent normal recent and remote memory, with normal attention span and concentration ability. Cranial nerves: Pupils are equal, round, and reactive to light. Discs are flat bilaterally.Extraocular movements are full. Visual fields are full. Facial symmetry is present. There is good sensation of the face to pinprick and soft touch bilaterally. The strength of the facial muscles and the muscles to head turning and shoulder shrug are normal bilaterally. Speech is well enunciated, no aphasia or dysarthria is noted. The tongue is midline, and the patient has symmetric elevation of the soft palate. No obvious hearing deficits are noted. Motor: The motor testing reveals 5 over 5 strength of all 4 extremities. Good symmetric motor tone is noted throughout. Sensory: Sensory testing is intact to pinprick, soft touch, vibration sensation, and position sense on all 4 extremities. No evidence of extinction is noted. Coordination: Cerebellar testing reveals good finger-nose-finger and heel-to-shin bilaterally. Gait and station: Gait is normal. Tandem gait is mildly unsteady Romberg is negative. No drift is seen. Reflexes: Deep tendon reflexes are symmetric and normal bilaterally. Toes are downgoing bilaterally.  DIAGNOSTIC DATA (LABS, IMAGING, TESTING) - I reviewed patient records, labs, notes, testing and imaging myself where  available.  Lab Results  Component Value Date   WBC 4.5 10/21/2015   HGB 13.0 10/16/2014   HCT 37.3 10/21/2015   MCV 85 10/21/2015   PLT 299 10/21/2015      Component Value Date/Time   NA 142 04/18/2015 1001   K 5.1 04/18/2015 1001   CL 102 04/18/2015 1001   CO2 24 04/18/2015 1001   GLUCOSE 96 04/18/2015 1001   BUN 12 04/18/2015 1001   CREATININE 0.71 04/18/2015 1001   CALCIUM 9.5 04/18/2015 1001   PROT 6.8 04/18/2015 1001   ALBUMIN 4.3 04/18/2015 1001   AST 15  04/18/2015 1001   ALT 14 04/18/2015 1001   ALKPHOS 108 04/18/2015 1001   BILITOT 0.3 04/18/2015 1001   BILITOT 0.3 10/16/2014 1256   GFRNONAA 114 04/18/2015 1001   GFRAA 131 04/18/2015 1001     ASSESSMENT AND PLAN 35 y.o. year old female  has a past medical history of Depression; Anxiety; Multiple sclerosis (HCC) (2012); Hyperlipidemia; and Obesity. here to follow-up for her multiple sclerosis. Her home sleep study after last visit was normal. New complaints of back pain for approximately 2 months that has not responded to anti-inflammatory agents. She has not had any back injury  Continue Gilenya at current dose will refill Will check labs today to monitor side effects if Gilenya Continue Zoloft for depression Encourage walking for exercise and healthy diet for weight loss Obtain MRI of the lumbar spine new complaint of back pain for several months which has not responded to anti-inflammatories Obtain MRI of the brain follow up for MS last in 2015 Follow-up in 6 months Nilda Riggs, [facility], [facility], APRN  [facility] [address], [address] [place], [place] [address] [phone]

## 2016-04-20 NOTE — Patient Instructions (Signed)
Continue Gilenya at current dose will refill Will check labs today to monitor side effects if Gilenya Continue Zoloft for depression Encourage walking for exercise and healthy diet for weight loss Obtained MRI of the lumbar spine no complaint of back pain Obtain MRI of the brain follow up for MS Follow-up in 6 months

## 2016-04-21 ENCOUNTER — Telehealth: Payer: Self-pay | Admitting: Nurse Practitioner

## 2016-04-21 LAB — COMPREHENSIVE METABOLIC PANEL
A/G RATIO: 1.8 (ref 1.2–2.2)
ALBUMIN: 4.2 g/dL (ref 3.5–5.5)
ALT: 9 IU/L (ref 0–32)
AST: 15 IU/L (ref 0–40)
Alkaline Phosphatase: 89 IU/L (ref 39–117)
BILIRUBIN TOTAL: 0.3 mg/dL (ref 0.0–1.2)
BUN / CREAT RATIO: 15 (ref 9–23)
BUN: 10 mg/dL (ref 6–20)
CHLORIDE: 101 mmol/L (ref 96–106)
CO2: 19 mmol/L (ref 18–29)
Calcium: 8.8 mg/dL (ref 8.7–10.2)
Creatinine, Ser: 0.65 mg/dL (ref 0.57–1.00)
GFR calc non Af Amer: 118 mL/min/{1.73_m2} (ref >59)
GFR, EST AFRICAN AMERICAN: 136 mL/min/{1.73_m2} (ref >59)
GLOBULIN, TOTAL: 2.4 g/dL (ref 1.5–4.5)
Glucose: 97 mg/dL (ref 65–99)
POTASSIUM: 3.6 mmol/L (ref 3.5–5.2)
SODIUM: 140 mmol/L (ref 134–144)
TOTAL PROTEIN: 6.6 g/dL (ref 6.0–8.5)

## 2016-04-21 LAB — CBC WITH DIFFERENTIAL/PLATELET
BASOS ABS: 0 10*3/uL (ref 0.0–0.2)
Basos: 0 %
EOS (ABSOLUTE): 0.1 10*3/uL (ref 0.0–0.4)
Eos: 1 %
HEMOGLOBIN: 12.6 g/dL (ref 11.1–15.9)
Hematocrit: 39.3 % (ref 34.0–46.6)
Immature Grans (Abs): 0 10*3/uL (ref 0.0–0.1)
Immature Granulocytes: 0 %
LYMPHS ABS: 0.4 10*3/uL — AB (ref 0.7–3.1)
Lymphs: 8 %
MCH: 27.7 pg (ref 26.6–33.0)
MCHC: 32.1 g/dL (ref 31.5–35.7)
MCV: 86 fL (ref 79–97)
MONOCYTES: 7 %
MONOS ABS: 0.4 10*3/uL (ref 0.1–0.9)
NEUTROS ABS: 4.5 10*3/uL (ref 1.4–7.0)
Neutrophils: 84 %
Platelets: 216 10*3/uL (ref 150–379)
RBC: 4.55 x10E6/uL (ref 3.77–5.28)
RDW: 14.2 % (ref 12.3–15.4)
WBC: 5.4 10*3/uL (ref 3.4–10.8)

## 2016-04-21 NOTE — Telephone Encounter (Signed)
-----   Message from Nilda Riggs, NP sent at 04/21/2016  7:54 AM EDT ----- Labs stable please call the patient

## 2016-04-21 NOTE — Telephone Encounter (Signed)
Called and left patient a message relaying labs were stable .

## 2016-04-21 NOTE — Progress Notes (Signed)
Quick Note:  Called and left patient a message relaying labs were stable. ______

## 2016-06-03 ENCOUNTER — Other Ambulatory Visit: Payer: Self-pay | Admitting: *Deleted

## 2016-06-03 NOTE — Telephone Encounter (Signed)
Hi Tamika,   This patient has not been seen in clinic since 09/2014. It seems that her neurologist has been refilling Zoloft at times. I do not think I should refill this if this patient has not been seen in clinic recently. What do you recommend?

## 2016-06-04 ENCOUNTER — Ambulatory Visit
Admission: RE | Admit: 2016-06-04 | Discharge: 2016-06-04 | Disposition: A | Discharge status: Home / Self Care | Payer: Managed Care, Other (non HMO) | Source: Ambulatory Visit | Attending: Nurse Practitioner | Admitting: Nurse Practitioner

## 2016-06-04 ENCOUNTER — Other Ambulatory Visit: Payer: Managed Care, Other (non HMO)

## 2016-06-04 DIAGNOSIS — G35 Multiple sclerosis: Secondary | ICD-10-CM | POA: Diagnosis not present

## 2016-06-04 MED ORDER — GADOBENATE DIMEGLUMINE 529 MG/ML IV SOLN
20.0000 mL | Freq: Once | INTRAVENOUS | Status: AC | PRN
  Administered 2016-06-04: 20 mL via INTRAVENOUS

## 2016-06-08 ENCOUNTER — Telehealth: Payer: Self-pay | Admitting: *Deleted

## 2016-06-08 NOTE — Telephone Encounter (Signed)
-----   Message from Nilda Riggs, NP sent at 06/08/2016 11:30 AM EDT ----- MRI of the brain without change . Your insurance denied MRI of the lumbar spine Please call the patient

## 2016-06-08 NOTE — Telephone Encounter (Signed)
I spoke to pt and relayed that her MRI brain no change from previous, no enhancing lesions.  Spoke to [place] in [place], she stated looks like [employer] denied the lumbar. She talked to [place] about pt this morning. She said it may be best for her PCP to initiate.  I called pt back.  Told her that she [place] [facility] authorized the MRI 's for her due to her [employer].  They may know reason for denial.  She can go thru her pcp and initiate from there about her low back pain, and they also can call her insurance co.  She verbalized understanding.

## 2016-06-12 ENCOUNTER — Other Ambulatory Visit: Payer: Self-pay | Admitting: *Deleted

## 2016-06-12 NOTE — Telephone Encounter (Signed)
Pt is requesting a refill on her Zoloft, she took her last one last night.  She is aware that she has not been seen in a while and has actually scheduled a physical for 07/15/16(first available with [place]).  She is just not sure what to do in the mean time while waiting for appt.  Wants to know if she can see another doctor just to get a refill. [place], Maryjo Rochester, CMA

## 2016-06-14 MED ORDER — SERTRALINE HCL 100 MG PO TABS: ORAL_TABLET | ORAL | 0 refills | Status: DC

## 2016-07-15 ENCOUNTER — Ambulatory Visit (INDEPENDENT_AMBULATORY_CARE_PROVIDER_SITE_OTHER): Payer: Managed Care, Other (non HMO) | Admitting: Internal Medicine

## 2016-07-15 ENCOUNTER — Encounter: Payer: Self-pay | Admitting: Internal Medicine

## 2016-07-15 ENCOUNTER — Other Ambulatory Visit (HOSPITAL_COMMUNITY)
Admission: RE | Admit: 2016-07-15 | Discharge: 2016-07-15 | Disposition: A | Discharge status: Home / Self Care | Payer: Managed Care, Other (non HMO) | Source: Ambulatory Visit | Attending: Family Medicine | Admitting: Family Medicine

## 2016-07-15 VITALS — BP 132/78 | HR 78 | Temp 98.6°F | Ht 64.0 in | Wt 202.0 lb

## 2016-07-15 DIAGNOSIS — Z01419 Encounter for gynecological examination (general) (routine) without abnormal findings: Secondary | ICD-10-CM | POA: Insufficient documentation

## 2016-07-15 DIAGNOSIS — Z114 Encounter for screening for human immunodeficiency virus [HIV]: Secondary | ICD-10-CM

## 2016-07-15 DIAGNOSIS — Z803 Family history of malignant neoplasm of breast: Secondary | ICD-10-CM

## 2016-07-15 DIAGNOSIS — Z1151 Encounter for screening for human papillomavirus (HPV): Secondary | ICD-10-CM | POA: Insufficient documentation

## 2016-07-15 DIAGNOSIS — Z124 Encounter for screening for malignant neoplasm of cervix: Secondary | ICD-10-CM

## 2016-07-15 DIAGNOSIS — Z23 Encounter for immunization: Secondary | ICD-10-CM

## 2016-07-15 DIAGNOSIS — E785 Hyperlipidemia, unspecified: Secondary | ICD-10-CM | POA: Diagnosis not present

## 2016-07-15 DIAGNOSIS — Z0000 Encounter for general adult medical examination without abnormal findings: Secondary | ICD-10-CM

## 2016-07-15 LAB — HIV ANTIBODY (ROUTINE TESTING W REFLEX): HIV 1&2 Ab, 4th Generation: NONREACTIVE

## 2016-07-15 NOTE — Assessment & Plan Note (Addendum)
With history of early breast cancer in her family (grandmother in late 8230s) patient my benefit from early breast cancer screening. Will order mammogram and call patient to schedule this.

## 2016-07-15 NOTE — Patient Instructions (Signed)
I made a referral to surgery for you to be evaluated to see if you need to start screening for breast cancer. You should get a call in about a week. Please call our clinic if you do not hear in about  2 weeks.  I will also check your cholesterol level

## 2016-07-15 NOTE — Progress Notes (Addendum)
   [facility] Phone: [phone]   Date of Visit: 07/15/2016   HPI:  Patient presents today for a well woman exam.   Concerns today:  Periods: regular on OCPs. Periods last 1-1.5 weeks  Contraception: OCP Pelvic symptoms: no vaginal discharge, irregular vaginal bleeding, irritation or rash  Sexual activity: yes; 1 partner. STD Screening: not concerned; no history of STI Pap smear status: Last pap smear over three years ago- normal. One abnormal Pap at 35 yr old- required biopsy. No further work up was needed per patient  Exercise: play with dogs, garden- every day/multiple times a day (20 minutes at a time) Diet: not eating as much unhealthy foods- smaller portions of unhealthy foods (pizza, soda, etc eat daily); vegetables (at least 2 servings), meat/protein; dessert-rarely. Has been working on this and has had good results of weight loss.  Smoking: none Alcohol:rare Drugs: marijuana- daily (helps with sleep)  Mood: PHQ-2 negative; On Zoloft  Dentist: q 6 months Eye: due for appointment   ROS: See HPI  [place]:  Cancers in family: dad-laryngeal (9yo) Paternal and Maternal GM: breast cancer (late 59s; 72s). Grandmothers were not tested for BRCA gene Mother- "cardiac event"- sudden death, patient is not sure what happened; autopsy was not done (33yo); bipolar, schizophrenia;no DM, HTN, no hx MI  Paternal GF: MS  PHYSICAL EXAM: BP 132/78   Pulse 78   Temp 98.6 F (37 C) (Oral)   Ht '5\' 4"'$  (1.626 m)   Wt 202 lb (91.6 kg)   LMP 07/04/2016   SpO2 95%   BMI 34.67 kg/m  Gen: NAD, pleasant, cooperative HEENT: NCAT, PERRL, no palpable thyromegaly or anterior cervical lymphadenopathy Heart: RRR, no murmurs Lungs: CTAB, NWOB Abdomen: soft, nontender to palpation Neuro: grossly nonfocal, speech normal GU: normal appearing external genitalia without lesions. Vagina is moist with white discharge. Cervix normal in appearance. No cervical motion tenderness or  tenderness on bimanual exam. No adnexal masses.   ASSESSMENT/PLAN:  # Health maintenance:  -STD screening: declined -pap smear: done today -lipid screening: history of HLD; lipids today along with HIV screening -immunizations: declined flu; Tdap today   Family history of breast cancer in female With history of early breast cancer in her family (grandmother in late 97s) patient my benefit from early breast cancer screening. Will order mammogram and call patient to schedule this.    Smiley Houseman, MD PGY [place]

## 2016-07-16 LAB — LIPID PANEL
CHOLESTEROL: 254 mg/dL — AB (ref 125–200)
HDL: 57 mg/dL (ref >=46)
LDL Cholesterol: 172 mg/dL — ABNORMAL HIGH (ref <130)
TRIGLYCERIDES: 126 mg/dL (ref <150)
Total CHOL/HDL Ratio: 4.5 Ratio (ref <=5.0)
VLDL: 25 mg/dL (ref <30)

## 2016-07-16 LAB — CYTOLOGY - PAP

## 2016-07-17 ENCOUNTER — Encounter: Payer: Self-pay | Admitting: Internal Medicine

## 2016-07-17 HISTORY — PX: INTRAUTERINE DEVICE (IUD) INSERTION: SHX5877

## 2016-07-17 NOTE — Progress Notes (Signed)
Total cholesterol and your LDL cholesterol elevated. We will try diet changes and exercise.  Normal Pap, will need repeat in 5 years. HIV neg  Letter sent to patient

## 2016-07-23 ENCOUNTER — Other Ambulatory Visit: Payer: Self-pay | Admitting: Internal Medicine

## 2016-07-28 NOTE — Addendum Note (Signed)
Addended by: Palma HolterGUNADASA, Jalaysha Skilton G on: 07/28/2016 08:45 AM   Modules accepted: Orders

## 2016-07-30 ENCOUNTER — Telehealth: Payer: Self-pay | Admitting: Internal Medicine

## 2016-07-30 ENCOUNTER — Other Ambulatory Visit: Payer: Self-pay | Admitting: Family Medicine

## 2016-07-30 NOTE — Telephone Encounter (Signed)
Second attempt to call [name] to discuss doing mammogram instead of referral to surgery to screen for breast cancer early as she does have a strong family history of breast cancer. Went to [facility]; asked patient to call back clinic.   Essentially I would like her to know the following: At her physical we discussed for her to be referred to surgery to determine the best method of breast cancer screening for her as usually we start screening at age 60; she would benefit from early screening due to family history. I spoke with one of my supervisors who recommends to cancel surgery referral and order a mammogram. I have ordered her mammogram, however she still needs to call the [facility] ([address], [address]) at [phone] to schedule an appointment.   If she does not return my call today, please attempt to call her to provide this information.

## 2016-07-31 ENCOUNTER — Other Ambulatory Visit: Payer: Self-pay | Admitting: Internal Medicine

## 2016-07-31 DIAGNOSIS — Z1231 Encounter for screening mammogram for malignant neoplasm of breast: Secondary | ICD-10-CM

## 2016-07-31 NOTE — Telephone Encounter (Signed)
Pt returns call, informed of message from MD.  She is agreeable to plan.  She also scheduled an appt to discuss getting a IUD. Jeron Grahn, Maryjo Rochester, CMA

## 2016-08-03 NOTE — Progress Notes (Signed)
   [facility] Phone: [phone]   Date of Visit: 08/05/2016   HPI:  Patient is here to discuss contraceptive options.  - reports she has been on OCP since she was a teenager dur to heavy periods.  - would like to discuss longer active contraceptive options - has read about IUD  - we also discussed Nexplanon - she does not desire pregnancy for at least 5 years  - has had the same sexual partner for years - no irregular bleeding currently  ROS: See HPI.  PMFSH:  MS FH of breast cancer   PHYSICAL EXAM: LMP 07/04/2016  Gen: NAD  ASSESSMENT/PLAN:  Contraceptive Discussion: discussed various options. Patient decided on 5 yr IUD.  - patient to make an appointment in GYN clinic for procedure.   Health Maintenance:  - declined flu shot   Palma Holter, MD PGY 2 [facility]

## 2016-08-05 ENCOUNTER — Ambulatory Visit (INDEPENDENT_AMBULATORY_CARE_PROVIDER_SITE_OTHER): Payer: Managed Care, Other (non HMO) | Admitting: Internal Medicine

## 2016-08-05 ENCOUNTER — Encounter: Payer: Self-pay | Admitting: Internal Medicine

## 2016-08-05 VITALS — BP 112/80 | HR 77 | Ht 64.0 in | Wt 197.0 lb

## 2016-08-05 DIAGNOSIS — Z3009 Encounter for other general counseling and advice on contraception: Secondary | ICD-10-CM

## 2016-08-05 DIAGNOSIS — Z309 Encounter for contraceptive management, unspecified: Secondary | ICD-10-CM

## 2016-08-05 NOTE — Patient Instructions (Signed)
Please make an appointment for GYN clinic for IUD insertion ( Mirena).

## 2016-08-10 ENCOUNTER — Ambulatory Visit
Admission: RE | Admit: 2016-08-10 | Discharge: 2016-08-10 | Disposition: A | Discharge status: Home / Self Care | Payer: Managed Care, Other (non HMO) | Source: Ambulatory Visit | Attending: Internal Medicine | Admitting: Internal Medicine

## 2016-08-10 DIAGNOSIS — Z1231 Encounter for screening mammogram for malignant neoplasm of breast: Secondary | ICD-10-CM

## 2016-08-20 ENCOUNTER — Ambulatory Visit (INDEPENDENT_AMBULATORY_CARE_PROVIDER_SITE_OTHER): Payer: Managed Care, Other (non HMO) | Admitting: Family Medicine

## 2016-08-20 VITALS — BP 141/83 | HR 87 | Temp 98.4°F | Ht 64.0 in | Wt 190.8 lb

## 2016-08-20 DIAGNOSIS — Z803 Family history of malignant neoplasm of breast: Secondary | ICD-10-CM | POA: Diagnosis not present

## 2016-08-20 DIAGNOSIS — Z975 Presence of (intrauterine) contraceptive device: Secondary | ICD-10-CM | POA: Diagnosis not present

## 2016-08-20 DIAGNOSIS — Z30430 Encounter for insertion of intrauterine contraceptive device: Secondary | ICD-10-CM

## 2016-08-20 HISTORY — PX: INTRAUTERINE DEVICE (IUD) INSERTION: SHX5877

## 2016-08-20 LAB — POCT URINE PREGNANCY: PREG TEST UR: NEGATIVE

## 2016-08-20 MED ORDER — LEVONORGESTREL 20 MCG/24HR IU IUD
INTRAUTERINE_SYSTEM | Freq: Once | INTRAUTERINE | Status: AC
  Administered 2016-08-20: 1 via INTRAUTERINE

## 2016-08-20 NOTE — Progress Notes (Signed)
Patient here for placement of IUD. Has been using oral contraceptives. She still has 3 or 4 pills left in the [facility] for this month and has questions about whether or not she should continue them once the IUD is placed. She is sexually active with one female partner for the last greater than 10 years. She has no vaginal or GYN complaints. Her recent Pap smear was performed and was normal.  Pertinent past medical history, social history. Patient never smoker. Paternal grandmother had breast cancer in her late 60s. She is on Fingolimod for her multiple sclerosis  IUD INSERTION: Patient given informed consent, signed copy in the chart..  Negative pregnancy confirmed.  Appropriate time out taken.   Sterile instruments and technique was used. Cervix brought into view with use of speculum and vaginal sidewall retractors; cervix then cleansed three times with  betadine swabs.  A tenaculum was placed into the anterior lip of the cervix and a uterine sound was used to measure uterine size.  ASSESSMENT: Contraceptive method change to IUD. PLAN: Recommended she go ahead and complete the oral contraceptives that she is off her this months just to maintain hormonal balance. We discussed was likely time of expulsion of IUD would be after menses. I answered all her questions.   A MIRENA IUD was placed into the endometrial cavity, deployed and secured. The applicator was removed. The strings were trimmed to 2 centimeters.   There were no complications and the patient tolerated the procedure well. Patient was able to identify IUD strings on vaginal self exam after the procedure was completed.   She was given handouts for post procedure instructions and information about the IUD including a card with the time of recommended removal.

## 2016-08-20 NOTE — Patient Instructions (Signed)
Check your IUD strings every month, usually after your cycle. You may have some spotting today and tomorrow but yu should not have any heavy bleeding (other than your period ). You should not develop bad pelvic pain or fever. If you have any questions please give me a call. I AM ALSO GIVING YOU A PAMPHLET ABOUT YOUR IUD THAT HAS SOME INFORMATION IN IT.

## 2016-08-23 ENCOUNTER — Other Ambulatory Visit: Payer: Self-pay | Admitting: Internal Medicine

## 2016-09-08 ENCOUNTER — Telehealth: Payer: Self-pay | Admitting: Nurse Practitioner

## 2016-09-08 MED ORDER — FINGOLIMOD HCL 0.5 MG PO CAPS: 0.5000 mg | ORAL_CAPSULE | Freq: Every day | ORAL | 1 refills | Status: DC

## 2016-09-08 NOTE — Addendum Note (Signed)
Addended byHermenia Fiscal on: 09/08/2016 04:05 PM   Modules accepted: Orders

## 2016-09-08 NOTE — Telephone Encounter (Signed)
[facility] [phone] called to request refill of Fingolimod HCl (GILENYA) 0.5 MG CAPS

## 2016-09-08 NOTE — Telephone Encounter (Signed)
escribed to [facility].  Gilenya 0.5mg  caps  (take one daily).  #90 and one refill.

## 2016-09-18 ENCOUNTER — Other Ambulatory Visit: Payer: Self-pay | Admitting: *Deleted

## 2016-09-21 MED ORDER — SERTRALINE HCL 100 MG PO TABS: 100.0000 mg | ORAL_TABLET | Freq: Every day | ORAL | 2 refills | Status: DC

## 2016-09-22 ENCOUNTER — Other Ambulatory Visit: Payer: Self-pay | Admitting: Internal Medicine

## 2016-10-20 ENCOUNTER — Encounter: Payer: Self-pay | Admitting: Nurse Practitioner

## 2016-10-20 ENCOUNTER — Ambulatory Visit (INDEPENDENT_AMBULATORY_CARE_PROVIDER_SITE_OTHER): Payer: Managed Care, Other (non HMO) | Admitting: Nurse Practitioner

## 2016-10-20 VITALS — BP 138/88 | HR 77 | Ht 64.0 in | Wt 195.0 lb

## 2016-10-20 DIAGNOSIS — G35 Multiple sclerosis: Secondary | ICD-10-CM | POA: Diagnosis not present

## 2016-10-20 DIAGNOSIS — F418 Other specified anxiety disorders: Secondary | ICD-10-CM

## 2016-10-20 NOTE — Progress Notes (Signed)
[facility]  PATIENT: Shelby [name] DOB: Aug 18, 1981   REASON FOR VISIT: Follow-up for multDennison Nancyiple sclerosis, depression and anxiety, back pain HISTORY FROM: Patient    HISTORY OF PRESENT ILLNESS:ILLNESSHISTORY: Shelby [name] is a 35 year old left-handed white female with a history of multiple sclerosis that was diagnosed in 2012. The presenting symptom was a significant left optic neuritis. The patient underwent MRI of the brain and was found to have multiple white matter lesions. The patient underwent a visual evoked response test and a brainstem auditory evoked response test. The patient never underwent a lumbar puncture, but she did have blood work. He was started on Gilenya, and she has been on Gilenya since 2012, with good tolerance. The patient occasionally will have some transient numbness of the right leg, but she has not sustained any significant permanent physical deficits from the multiple sclerosis. The vision in the left eye has essentially normalized. She denies any bowel or bladder control issues, and she denies any balance issues. She does not have weakness of the extremities. She is not having side effects from the Gilenya. [place] recently moved to this area from [place], and she is seeking another neurologist. UPDATE 10/21/15 CMMs. Shelby [name], 35 year old female returns for follow-up. She has history of multiple sclerosis and was last seen in this office 04/18/15. She moved here from [place] one and one half years ago. She has had MS since 2012. She is currently on Gilenya tolerating the medication without side effects. She denies any balance issues weakness of the extremities, bladder or bowel difficulty. She denies any visual disturbance speech or swallowing problems. She does complain of daytime sleepiness and not feeling rested after sleeping at night. She denies morning headache. She returns for reevaluation. Home sleep study was normal. She has no regular  exercise. She is on Zoloft for her depression. MRI brain 10/26/14: IMPRESSION:  Abnormal MRI brain (with and without) demonstrating:1. Multiple round and ovoid, periventricular, subcortical, peri-callosal and right cerebellar chronic demyelinating plaques.  No abnormal lesions are seen on post contrast views.: UPDATE 06/05/2017CM Shelby [name], 35 year old female returns for follow-up. She has a history of multiple sclerosis and is currently on Gilenya without side effects. She denies any balance issues weakness of the extremities bladder or bowel difficulty. She has not had speech or swallowing difficulties. Recent home sleep study was normal. In addition she has new onset of lower back pain which has been going on for approximately 2 months. She is not aware of any apparent injury. She says Motrin and anti-inflammatories have not been helpful. She gets no regular exercise. She has not had imaging of her low back. She returns for reevaluation UPDATE 12/05/2017CM Shelby [name], 35 year old female returns for follow-up with history of multiple sclerosis. She is currently on Gilenya without side effects. She denies any bowel or bladder difficulty and no balance issues no spasticity. She is continuing to do Yoga,  her back pain is intermittent. MRI of the brain after her last visit 06/04/2016 was abnormal showing multiple periventricular subcortical and pericallosal white matter hyperintensities quite characteristic for demyelinating disease. No enhancing lesions were noted. She returns for reevaluation REVIEW OF SYSTEMS: Full 14 system review of systems performed and notable only for those listed, all others are neg:  Constitutional: neg  Cardiovascular: neg Ear/Nose/Throat: neg  Skin: neg Eyes: neg Respiratory: neg Gastroitestinal: neg  Hematology/Lymphatic: Neg Endocrine: neg Musculoskeletal: Joint pain back pain Allergy/Immunology: neg Neurological: Occasional headache Psychiatric: Anxiety Sleep :  neg   ALLERGIES: Allergies  Allergen Reactions  . Bactrim [Sulfamethoxazole-Trimethoprim] Anaphylaxis    HOME MEDICATIONS: Outpatient Medications Prior to Visit  Medication Sig Dispense Refill  . Fingolimod HCl (GILENYA) 0.5 MG CAPS Take 1 capsule (0.5 mg total) by mouth daily. 90 capsule 1  . sertraline (ZOLOFT) 100 MG tablet Take 1 tablet (100 mg total) by mouth daily. 30 tablet 2   No facility-administered medications prior to visit.     PAST MEDICAL HISTORY: Past Medical History:  Diagnosis Date  . Anxiety   . Depression   . Hyperlipidemia   . Multiple sclerosis (HCC) 2012  . Obesity     PAST SURGICAL HISTORY: Past Surgical History:  Procedure Laterality Date  . INTRAUTERINE DEVICE (IUD) INSERTION  07/2016  . WISDOM TOOTH EXTRACTION N/A 2003    FAMILY HISTORY: Family History  Problem Relation Age of Onset  . Bipolar disorder Mother   . Schizophrenia Mother   . Early death Mother   . Heart attack Mother   . Throat cancer Father   . Early death Father   . Multiple sclerosis Maternal Grandmother   . Cancer Maternal Grandmother   . Breast cancer Paternal Grandmother   . Multiple sclerosis Paternal Grandfather   . Multiple sclerosis Maternal Aunt     SOCIAL HISTORY: Social History   Social History  . Marital status: Married    Spouse name: N/A  . Number of children: 0  . Years of education: HS   Occupational History  . marketing    Social History Main Topics  . Smoking status: Never Smoker  . Smokeless tobacco: Never Used  . Alcohol use 0.0 oz/week     Comment: rare  . Drug use: No  . Sexual activity: Yes    Birth control/ protection: Pill   Other Topics Concern  . Not on file   Social History Narrative   Patient is left handed.   Patient lives at home with significant other   Patient drinks coffee, tea, soda daily      PHYSICAL EXAM  Vitals:   10/20/16 1320  BP: 138/88  Pulse: 77  Weight: 195 lb (88.5 kg)  Height: 5\' 4"  (1.626  m)   Body mass index is 33.47 kg/m. General: The patient is alert and cooperative at the time of the examination. The patient is markedly obese. Neck: The neck is supple, no carotid bruits are noted. Cardiovascular: The cardiovascular examination reveals a regular rate and rhythm, no obvious murmurs or rubs are noted. Skin: Extremities are without significant edema.  Neurologic Exam  Mental status: The patient is alert and oriented x 3 at the time of the examination. The patient has apparent normal recent and remote memory, with normal attention span and concentration ability. Cranial nerves: Visual acuity 20/20 bilaterally Pupils are equal, round, and reactive to light. Discs are flat bilaterally.Extraocular movements are full. Visual fields are full. Facial symmetry is present. There is good sensation of the face to pinprick and soft touch bilaterally. The strength of the facial muscles and the muscles to head turning and shoulder shrug are normal bilaterally. Speech is well enunciated, no aphasia or dysarthria is noted. The tongue is midline, and the patient has symmetric elevation of the soft palate. No obvious hearing deficits are noted. Motor: The motor testing reveals 5 over 5 strength of all 4 extremities. Good symmetric motor tone is noted throughout. Sensory: Sensory testing is intact to pinprick, soft touch, vibration sensation, all 4 extremities. No evidence of extinction is noted. Coordination: Cerebellar  testing reveals good finger-nose-finger and heel-to-shin bilaterally. Gait and station: Gait is normal. Tandem gait is mildly unsteady Romberg is negative. No drift is seen. Reflexes: Deep tendon reflexes are symmetric and normal bilaterally. Toes are downgoing bilaterally.  DIAGNOSTIC DATA (LABS, IMAGING, TESTING) - I reviewed patient records, labs, notes, testing and imaging myself where available.  Lab Results  Component Value Date   WBC 5.4 04/20/2016   HGB 13.0  10/16/2014   HCT 39.3 04/20/2016   MCV 86 04/20/2016   PLT 216 04/20/2016      Component Value Date/Time   NA 140 04/20/2016 1134   K 3.6 04/20/2016 1134   CL 101 04/20/2016 1134   CO2 19 04/20/2016 1134   GLUCOSE 97 04/20/2016 1134   BUN 10 04/20/2016 1134   CREATININE 0.65 04/20/2016 1134   CALCIUM 8.8 04/20/2016 1134   PROT 6.6 04/20/2016 1134   ALBUMIN 4.2 04/20/2016 1134   AST 15 04/20/2016 1134   ALT 9 04/20/2016 1134   ALKPHOS 89 04/20/2016 1134   BILITOT 0.3 04/20/2016 1134   GFRNONAA 118 04/20/2016 1134   GFRAA 136 04/20/2016 1134     ASSESSMENT AND PLAN 35 y.o. year old female  has a past medical history of Depression; Anxiety; Multiple sclerosis (HCC) (2012); Hyperlipidemia; and Obesity. here to follow-up for her multiple sclerosis. Her home sleep study  was normal. MRI of the brain after her last visit 06/04/2016 was abnormal showing multiple periventricular subcortical and pericallosal white matter hyperintensities quite characteristic for demyelinating disease. No enhancing lesions were noted.   PLAN:Continue Gilenya at current dose  Will check labs today to monitor side effects of Gilenya Continue Zoloft for depression Encourage walking for exercise and healthy diet for weight loss Given a list of back exercises to do, continue Yoga and stretching Follow-up in 6 months Nilda Riggs, [facility], [facility], APRN  [facility] [address] [place], [place] [address] [phone]

## 2016-10-20 NOTE — Progress Notes (Signed)
I have read the note, and I agree with the clinical assessment and plan.  Shelby [name],Shelby [name]   

## 2016-10-20 NOTE — Patient Instructions (Signed)
Continue Gilenya at current dose will refill Will check labs today to monitor side effects if Gilenya Continue Zoloft for depression Encourage walking for exercise and healthy diet for weight loss Follow-up in 6 months

## 2016-10-21 LAB — COMPREHENSIVE METABOLIC PANEL
A/G RATIO: 1.8 (ref 1.2–2.2)
ALT: 22 IU/L (ref 0–32)
AST: 17 IU/L (ref 0–40)
Albumin: 4.4 g/dL (ref 3.5–5.5)
Alkaline Phosphatase: 102 IU/L (ref 39–117)
BILIRUBIN TOTAL: 0.3 mg/dL (ref 0.0–1.2)
BUN / CREAT RATIO: 13 (ref 9–23)
BUN: 9 mg/dL (ref 6–20)
CHLORIDE: 103 mmol/L (ref 96–106)
CO2: 26 mmol/L (ref 18–29)
Calcium: 9.9 mg/dL (ref 8.7–10.2)
Creatinine, Ser: 0.68 mg/dL (ref 0.57–1.00)
GFR calc Af Amer: 133 mL/min/{1.73_m2} (ref >59)
GFR, EST NON AFRICAN AMERICAN: 115 mL/min/{1.73_m2} (ref >59)
GLOBULIN, TOTAL: 2.4 g/dL (ref 1.5–4.5)
Glucose: 91 mg/dL (ref 65–99)
POTASSIUM: 5.3 mmol/L — AB (ref 3.5–5.2)
SODIUM: 147 mmol/L — AB (ref 134–144)
Total Protein: 6.8 g/dL (ref 6.0–8.5)

## 2016-10-21 LAB — CBC WITH DIFFERENTIAL/PLATELET
BASOS ABS: 0 10*3/uL (ref 0.0–0.2)
BASOS: 0 %
EOS (ABSOLUTE): 0.1 10*3/uL (ref 0.0–0.4)
Eos: 2 %
Hematocrit: 42.9 % (ref 34.0–46.6)
Hemoglobin: 13.4 g/dL (ref 11.1–15.9)
IMMATURE GRANS (ABS): 0 10*3/uL (ref 0.0–0.1)
Immature Granulocytes: 1 %
LYMPHS ABS: 0.7 10*3/uL (ref 0.7–3.1)
Lymphs: 15 %
MCH: 28.2 pg (ref 26.6–33.0)
MCHC: 31.2 g/dL — AB (ref 31.5–35.7)
MCV: 90 fL (ref 79–97)
MONOS ABS: 0.4 10*3/uL (ref 0.1–0.9)
Monocytes: 8 %
NEUTROS ABS: 3.7 10*3/uL (ref 1.4–7.0)
Neutrophils: 74 %
PLATELETS: 241 10*3/uL (ref 150–379)
RBC: 4.76 x10E6/uL (ref 3.77–5.28)
RDW: 14.1 % (ref 12.3–15.4)
WBC: 5 10*3/uL (ref 3.4–10.8)

## 2016-11-30 ENCOUNTER — Telehealth: Payer: Self-pay | Admitting: *Deleted

## 2016-11-30 NOTE — Telephone Encounter (Signed)
Received PA for gilenya effectived from 11-23-2016 thru 11-23-2017 [employer]  REQ HD#[id number],  PT ID#  [id number].  ssy

## 2017-01-04 ENCOUNTER — Other Ambulatory Visit: Payer: Self-pay | Admitting: Internal Medicine

## 2017-03-04 ENCOUNTER — Encounter: Payer: Self-pay | Admitting: Nurse Practitioner

## 2017-03-16 NOTE — Telephone Encounter (Signed)
Fax confirmation received 03-15-17 for enrollment for gilenya.  Received fax from [place] go program that will begin processing request soon.

## 2017-03-22 ENCOUNTER — Telehealth: Payer: Self-pay | Admitting: *Deleted

## 2017-03-22 NOTE — Telephone Encounter (Signed)
Received fax for temporary supply of gilenya, completed and signed.  Received fax confirmation. [phone] fx, [phone].

## 2017-04-17 ENCOUNTER — Other Ambulatory Visit: Payer: Self-pay | Admitting: Internal Medicine

## 2017-04-19 NOTE — Telephone Encounter (Signed)
Patient will need a follow up visit for further refills. Please have her make a follow up visit.

## 2017-04-20 ENCOUNTER — Ambulatory Visit: Payer: Managed Care, Other (non HMO) | Admitting: Nurse Practitioner

## 2017-04-20 NOTE — Telephone Encounter (Signed)
Pt contacted, no answer. A VM was left informing pt her rx has been refilled, however, she will need to make a FU apt with her PCP to receive any more refills after this month. Please assist her in scheduling.

## 2017-05-05 ENCOUNTER — Encounter: Payer: Self-pay | Admitting: Nurse Practitioner

## 2017-05-05 ENCOUNTER — Ambulatory Visit (INDEPENDENT_AMBULATORY_CARE_PROVIDER_SITE_OTHER): Payer: BLUE CROSS/BLUE SHIELD | Admitting: Nurse Practitioner

## 2017-05-05 VITALS — BP 118/76 | HR 83 | Ht 64.0 in | Wt 153.8 lb

## 2017-05-05 DIAGNOSIS — Z5181 Encounter for therapeutic drug level monitoring: Secondary | ICD-10-CM

## 2017-05-05 DIAGNOSIS — G35 Multiple sclerosis: Secondary | ICD-10-CM | POA: Diagnosis not present

## 2017-05-05 DIAGNOSIS — F418 Other specified anxiety disorders: Secondary | ICD-10-CM | POA: Diagnosis not present

## 2017-05-05 MED ORDER — SERTRALINE HCL 100 MG PO TABS: 100.0000 mg | ORAL_TABLET | Freq: Every day | ORAL | 1 refills | Status: DC

## 2017-05-05 NOTE — Patient Instructions (Signed)
Continue Gilenya at current dose  Will check labs today to monitor side effects of Gilenya Continue Zoloft for depression will refill Encourage walking for exercise and healthy diet for weight loss Congratulated on weight Continue back exercises to do, stretching exercises  Follow-up in 6 months

## 2017-05-05 NOTE — Progress Notes (Signed)
[facility]  PATIENT: Shelby [name] DOB: [birth year]   REASON FOR VISIT: Follow-up for multiple sclerosis, depression and anxiety, back pain HISTORY FROM: Patient    HISTORY OF PRESENT ILLNESS:ILLNESSHISTORY: [name] is a 36 year old left-handed white female with a history of multiple sclerosis that was diagnosed in 2012. The presenting symptom was a significant left optic neuritis. The patient underwent MRI of the brain and was found to have multiple white matter lesions. The patient underwent a visual evoked response test and a brainstem auditory evoked response test. The patient never underwent a lumbar puncture, but she did have blood work. He was started on Gilenya, and she has been on Gilenya since 2012, with good tolerance. The patient occasionally will have some transient numbness of the right leg, but she has not sustained any significant permanent physical deficits from the multiple sclerosis. The vision in the left eye has essentially normalized. She denies any bowel or bladder control issues, and she denies any balance issues. She does not have weakness of the extremities. She is not having side effects from the Gilenya. She recently moved to this area from [place], and she is seeking another neurologist. UPDATE 10/21/15 CMMs. Shelby [name], 36 year old female returns for follow-up. She has history of multiple sclerosis and was last seen in this office 04/18/15. She moved here from [place] one and one half years ago. She has had MS since 2012. She is currently on Gilenya tolerating the medication without side effects. She denies any balance issues weakness of the extremities, bladder or bowel difficulty. She denies any visual disturbance speech or swallowing problems. She does complain of daytime sleepiness and not feeling rested after sleeping at night. She denies morning headache. She returns for reevaluation. Home sleep study was normal. She has no regular  exercise. She is on Zoloft for her depression. MRI brain 10/26/14: IMPRESSION:  Abnormal MRI brain (with and without) demonstrating:1. Multiple round and ovoid, periventricular, subcortical, peri-callosal and right cerebellar chronic demyelinating plaques.  No abnormal lesions are seen on post contrast views.: UPDATE 06/05/2017CM Shelby [name], 36 year old female returns for follow-up. She has a history of multiple sclerosis and is currently on Gilenya without side effects. She denies any balance issues weakness of the extremities bladder or bowel difficulty. She has not had speech or swallowing difficulties. Recent home sleep study was normal. In addition she has new onset of lower back pain which has been going on for approximately 2 months. She is not aware of any apparent injury. She says Motrin and anti-inflammatories have not been helpful. She gets no regular exercise. She has not had imaging of her low back. She returns for reevaluation UPDATE 12/05/2017CM Shelby [name], 36 year old female returns for follow-up with history of multiple sclerosis. She is currently on Gilenya without side effects. She denies any bowel or bladder difficulty and no balance issues no spasticity. She is continuing to do Yoga,  her back pain is intermittent. MRI of the brain after her last visit 06/04/2016 was abnormal showing multiple periventricular subcortical and pericallosal white matter hyperintensities quite characteristic for demyelinating disease. No enhancing lesions were noted. She returns for reevaluation UPDATE 06/20/2018CM Shelby [name], 36 year old female returns for follow-up with history of multiple sclerosis. She remains on Gilenya without side effects. She denies any weakness, sensory changes, visual disturbance ,speech or swallowing difficulty.  She has lost 42 pounds since last seen just by portion control but she does exercises she was congratulated for this. Most recent MRI of the brain July 2017  was abnormal  showing multiple periventricular subcortical and pericallosal  white matter hyperintensities characteristic for demyelinating disease. 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 Bactrim [Sulfamethoxazole-Trimethoprim] Anaphylaxis    HOME MEDICATIONS: Outpatient Medications Prior to Visit  Medication Sig Dispense Refill  . Fingolimod HCl (GILENYA) 0.5 MG CAPS Take 1 capsule (0.5 mg total) by mouth daily. 90 capsule 1  . sertraline (ZOLOFT) 100 MG tablet TAKE 1 TABLET BY MOUTH EVERY DAY 30 tablet 0   No facility-administered medications prior to visit.     PAST MEDICAL HISTORY: Past Medical History:  Diagnosis Date  . Anxiety   . Depression   . Hyperlipidemia   . Multiple sclerosis (HCC) 2012  . Obesity     PAST SURGICAL HISTORY: Past Surgical History:  Procedure Laterality Date  . INTRAUTERINE DEVICE (IUD) INSERTION  07/2016  . WISDOM TOOTH EXTRACTION N/A 2003    FAMILY HISTORY: Family History  Problem Relation Age of Onset  . Bipolar disorder Mother   . Schizophrenia Mother   . Early death Mother   . Heart attack Mother   . Throat cancer Father   . Early death Father   . Multiple sclerosis Maternal Grandmother   . Cancer Maternal Grandmother   . Breast cancer Paternal Grandmother   . Multiple sclerosis Paternal Grandfather   . Multiple sclerosis Maternal Aunt     SOCIAL HISTORY: Social History   Social History  . Marital status: Married    Spouse  name: N/A  . Number of children: 0  . Years of education: HS   Occupational History  . marketing    Social History Main Topics  . Smoking status: Never Smoker  . Smokeless tobacco: Never Used  . Alcohol use 0.0 oz/week     Comment: rare  . Drug use: No  . Sexual activity: Yes    Birth control/ protection: Pill   Other Topics Concern  . Not on file   Social History Narrative   Patient is left handed.   Patient lives at home with significant other   Patient drinks coffee, tea, soda daily      PHYSICAL EXAM  Vitals:   05/05/17 0928  BP: 118/76  Pulse: 83  Weight: 153 lb 12.8 oz (69.8 kg)  Height: 5\' 4"  (1.626 m)   Body mass index is 26.4 kg/m. General: The patient is alert and cooperative at the time of the examination. Neck: The neck is supple, . Cardiovascular: The cardiovascular examination reveals a regular rate and rhythm, no obvious murmurs or rubs are noted. Skin: Extremities are without significant edema.  Neurologic Exam  Mental status: The patient is alert and oriented x 3 at the time of the examination. The patient has apparent normal recent and remote memory, with normal attention span and concentration ability. Cranial nerves: Visual acuity 20/20 bilaterally Pupils are equal, round, and reactive to light. Discs are flat bilaterally.Extraocular movements are full. Visual fields are full. Facial symmetry is present. There is good sensation of the face to pinprick and soft touch bilaterally. The strength of the facial muscles  and the muscles to head turning and shoulder shrug are normal bilaterally. Speech is well enunciated, no aphasia or dysarthria is noted. The tongue is midline, and the patient has symmetric elevation of the soft palate. No obvious hearing deficits are noted. Motor: The motor testing reveals 5 over 5 strength of all 4 extremities. Good symmetric motor tone is noted throughout. Sensory: Sensory testing is intact to pinprick, soft touch,  vibration sensation, all 4 extremities. No evidence of extinction is noted. Coordination: Cerebellar testing reveals good finger-nose-finger and heel-to-shin bilaterally. Gait and station: Gait is normal. Tandem gait is steady Romberg is negative. No drift is seen. Reflexes: Deep tendon reflexes are symmetric and normal bilaterally. Toes are downgoing bilaterally.  DIAGNOSTIC DATA (LABS, IMAGING, TESTING) - I reviewed patient records, labs, notes, testing and imaging myself where available.  Lab Results  Component Value Date   WBC 5.0 10/20/2016   HGB 13.4 10/20/2016   HCT 42.9 10/20/2016   MCV 90 10/20/2016   PLT 241 10/20/2016      Component Value Date/Time   NA 147 (H) 10/20/2016 1354   K 5.3 (H) 10/20/2016 1354   CL 103 10/20/2016 1354   CO2 26 10/20/2016 1354   GLUCOSE 91 10/20/2016 1354   BUN 9 10/20/2016 1354   CREATININE 0.68 10/20/2016 1354   CALCIUM 9.9 10/20/2016 1354   PROT 6.8 10/20/2016 1354   ALBUMIN 4.4 10/20/2016 1354   AST 17 10/20/2016 1354   ALT 22 10/20/2016 1354   ALKPHOS 102 10/20/2016 1354   BILITOT 0.3 10/20/2016 1354   GFRNONAA 115 10/20/2016 1354   GFRAA 133 10/20/2016 1354     ASSESSMENT AND PLAN 36 y.o. year old female  has a past medical history of Depression; Anxiety; Multiple sclerosis (HCC) (2012); Hyperlipidemia;  here to follow-up for her multiple sclerosis. Her home sleep study  was normal. MRI of the brain 06/04/2016 was abnormal showing multiple periventricular subcortical and pericallosal white matter hyperintensities quite characteristic for demyelinating disease. No enhancing lesions were noted. She has lost 42 pounds since last seen   PLAN:Continue Gilenya at current dose  Will check labs today to monitor side effects of Gilenya Continue Zoloft for depression will refill Encourage walking for exercise and healthy diet for weight loss Congratulated on weight loss Continue back exercises to do, stretching exercises  Follow-up in 6  months Nilda Riggs, [facility], [facility], APRN  [facility] [address] [place], [place] [address] [phone]

## 2017-05-05 NOTE — Progress Notes (Signed)
I have read the note, and I agree with the clinical assessment and plan.  Laveta Gilkey KEITH   

## 2017-05-06 ENCOUNTER — Telehealth: Payer: Self-pay | Admitting: *Deleted

## 2017-05-06 DIAGNOSIS — G35 Multiple sclerosis: Secondary | ICD-10-CM

## 2017-05-06 LAB — COMPREHENSIVE METABOLIC PANEL
ALK PHOS: 84 IU/L (ref 39–117)
ALT: 25 IU/L (ref 0–32)
AST: 18 IU/L (ref 0–40)
Albumin/Globulin Ratio: 2.1 (ref 1.2–2.2)
Albumin: 4.8 g/dL (ref 3.5–5.5)
BILIRUBIN TOTAL: 0.5 mg/dL (ref 0.0–1.2)
BUN/Creatinine Ratio: 17 (ref 9–23)
BUN: 12 mg/dL (ref 6–20)
CHLORIDE: 103 mmol/L (ref 96–106)
CO2: 24 mmol/L (ref 20–29)
CREATININE: 0.71 mg/dL (ref 0.57–1.00)
Calcium: 10 mg/dL (ref 8.7–10.2)
GFR calc Af Amer: 129 mL/min/{1.73_m2} (ref >59)
GFR calc non Af Amer: 112 mL/min/{1.73_m2} (ref >59)
GLUCOSE: 98 mg/dL (ref 65–99)
Globulin, Total: 2.3 g/dL (ref 1.5–4.5)
Potassium: 5.6 mmol/L — ABNORMAL HIGH (ref 3.5–5.2)
SODIUM: 143 mmol/L (ref 134–144)
Total Protein: 7.1 g/dL (ref 6.0–8.5)

## 2017-05-06 LAB — CBC WITH DIFFERENTIAL/PLATELET
BASOS ABS: 0 10*3/uL (ref 0.0–0.2)
Basos: 1 %
EOS (ABSOLUTE): 0.1 10*3/uL (ref 0.0–0.4)
EOS: 2 %
HEMATOCRIT: 45.7 % (ref 34.0–46.6)
Hemoglobin: 15 g/dL (ref 11.1–15.9)
IMMATURE GRANULOCYTES: 0 %
Immature Grans (Abs): 0 10*3/uL (ref 0.0–0.1)
Lymphocytes Absolute: 0.6 10*3/uL — ABNORMAL LOW (ref 0.7–3.1)
Lymphs: 15 %
MCH: 29 pg (ref 26.6–33.0)
MCHC: 32.8 g/dL (ref 31.5–35.7)
MCV: 88 fL (ref 79–97)
Monocytes Absolute: 0.5 10*3/uL (ref 0.1–0.9)
Monocytes: 13 %
NEUTROS PCT: 69 %
Neutrophils Absolute: 2.6 10*3/uL (ref 1.4–7.0)
PLATELETS: 221 10*3/uL (ref 150–379)
RBC: 5.18 x10E6/uL (ref 3.77–5.28)
RDW: 13.4 % (ref 12.3–15.4)
WBC: 3.7 10*3/uL (ref 3.4–10.8)

## 2017-05-06 NOTE — Telephone Encounter (Signed)
Spoke to pt and relayed that her labs were ok, exception potassium was mildly elevated.  Repeat in 1 month.,  She verbalized understanding.

## 2017-05-06 NOTE — Telephone Encounter (Signed)
-----   Message from Nilda Riggs, NP sent at 05/06/2017  2:55 PM EDT ----- Labs okay except potassium mildly elevated. Repeat in 1 month please call patient

## 2017-05-07 NOTE — Telephone Encounter (Signed)
BMP will be enough

## 2017-05-07 NOTE — Addendum Note (Signed)
Addended by: Guy Begin on: 05/07/2017 08:29 AM   Modules accepted: Orders

## 2017-05-07 NOTE — Telephone Encounter (Signed)
Noted  

## 2017-05-27 ENCOUNTER — Telehealth: Payer: Self-pay | Admitting: Nurse Practitioner

## 2017-05-27 NOTE — Telephone Encounter (Signed)
Montrese from [employer] is asking to be called back with the 1st dose observation date of Fingolimod HCl (GILENYA) 0.5 MG CAPS for pt, please call her at [phone]

## 2017-05-27 NOTE — Telephone Encounter (Signed)
I called Shelby [name]. Pt has had insurance change and they were asking for her initial FDO date for Gilenya.  I did not have that date on EPIC.  She stated that was ok, nothing more needed at this time.

## 2017-05-31 MED ORDER — FINGOLIMOD HCL 0.5 MG PO CAPS: 0.5000 mg | ORAL_CAPSULE | Freq: Every day | ORAL | 1 refills | Status: DC

## 2017-05-31 NOTE — Telephone Encounter (Signed)
New Gilenya Rx for 6 months e scribed to [facility].

## 2017-05-31 NOTE — Addendum Note (Signed)
Addended by: [place] Pink on: 05/31/2017 04:01 PM   Modules accepted: Orders

## 2017-05-31 NOTE — Telephone Encounter (Signed)
Shelby [name] with Gilenya Go program called to give updated pharmacy change due to patients insurance.  Patient will now need to be using [facility] and they are requesting a new RX for Fingolimod HCl (GILENYA) 0.5 MG CAPS to be sent to them.. Phone number [phone] No fax number information.  Please call

## 2017-06-08 ENCOUNTER — Telehealth: Payer: Self-pay | Admitting: Neurology

## 2017-06-08 NOTE — Telephone Encounter (Signed)
Received fax from [employer] that PA Gilenya approved effective 06/08/17-06/09/2019.

## 2017-06-08 NOTE — Telephone Encounter (Signed)
Just received forms this am to complete PA. In process of completing.

## 2017-06-08 NOTE — Telephone Encounter (Signed)
Regina from [facility] (the coordinator) calling for a prior authorization for Fingolimod HCl (GILENYA) 0.5 MG CAPS  the P.A.# [phone] if there are questions for Rene Kocher she can be reached at [phone]

## 2017-06-08 NOTE — Telephone Encounter (Signed)
Faxed completed/signed PA form for Gilenya back to [employer]. Fax: [phone]. Received confirmation. Awaiting response.

## 2017-10-18 ENCOUNTER — Other Ambulatory Visit: Payer: Self-pay | Admitting: Neurology

## 2017-10-18 NOTE — Telephone Encounter (Signed)
LVM for pt to call and discuss rx Gilenya. Rx was last sent 05/31/17 qty 90 (90 days supply) with 1 refill. She should have enough to get her until her appt on 11/04/17. Wanted to make sure with her that she medication on hand

## 2017-11-04 ENCOUNTER — Ambulatory Visit: Payer: BLUE CROSS/BLUE SHIELD | Admitting: Nurse Practitioner

## 2017-11-20 ENCOUNTER — Other Ambulatory Visit: Payer: Self-pay | Admitting: Nurse Practitioner

## 2018-01-06 ENCOUNTER — Other Ambulatory Visit: Payer: Self-pay

## 2018-01-06 ENCOUNTER — Telehealth: Payer: Self-pay | Admitting: Neurology

## 2018-01-06 ENCOUNTER — Telehealth: Payer: Self-pay | Admitting: Nurse Practitioner

## 2018-01-06 MED ORDER — FINGOLIMOD HCL 0.5 MG PO CAPS: ORAL_CAPSULE | ORAL | 0 refills | Status: DC

## 2018-01-06 NOTE — Telephone Encounter (Signed)
Called patient to schedule follow-up. Left voicemail to schedule. Patient needs to be seen before May 2019.

## 2018-01-06 NOTE — Telephone Encounter (Signed)
Refill done for 3 months only. Pt last seen 04/2017. She cancel appt in Dec 2018 due to insurance change. Pt needs an appt within 3 months to continue refills.

## 2018-01-06 NOTE — Telephone Encounter (Signed)
Pt calling for a refill on   GILENYA 0.5 MG CAPS   She states she has switched insurance companies Pt now has : [employer] ID: [id number] [id number] RX ATF#[id number]

## 2018-01-12 NOTE — Telephone Encounter (Signed)
Called patient back. Relayed information below. [facility] and see if they can process with new insurance. If not we can send to [facility] pharmacy instead if this is the pharmacy she needs to use instead. She will call back and let us know.

## 2018-01-12 NOTE — Telephone Encounter (Signed)
Pt calling back stating she has not been contacted by [place] yet re: her GILENYA 0.5 MG CAPS.  Pt states she is down to a few weeks worth yet and is wanting to resolve the refill thru current insurance.  Pt is asking to be called

## 2018-01-12 NOTE — Telephone Encounter (Signed)
[facility] rx refill sent on 01/06/18. They stated pt last received rx 10/17/17 qty 90 for 90 days supply. She needs to contact them refill and update her insurance information. They are still showing [employer]. Advised pt now has [place]. They were unable to tell me if they were in network with [employer]. States pt must call and they have to do another benefit investigation with her insurance. Advised I will call and update pt with information.

## 2018-01-19 MED ORDER — FINGOLIMOD HCL 0.5 MG PO CAPS: ORAL_CAPSULE | ORAL | 0 refills | Status: DC

## 2018-01-19 NOTE — Addendum Note (Signed)
Addended by: Eilene Ghazi L on: 01/19/2018 11:55 AM   Modules accepted: Orders

## 2018-01-19 NOTE — Telephone Encounter (Signed)
Called pt. Advised I will send refill to [facility] specialty for her.   Noticed pt had not r/s follow up she had to cancel back in December d/t change in insurance. This was her 6 month f/u. Made appt with Dr. Anne Hahn for 02/01/18 at 9am. Pt verbalized understanding and appreciation for call.

## 2018-01-19 NOTE — Telephone Encounter (Signed)
Pt called stating medication will need to go through [facility], [facility].

## 2018-02-01 ENCOUNTER — Ambulatory Visit: Payer: Managed Care, Other (non HMO) | Admitting: Neurology

## 2018-02-01 ENCOUNTER — Encounter: Payer: Self-pay | Admitting: Neurology

## 2018-02-01 VITALS — BP 120/69 | HR 69 | Ht 64.0 in | Wt 137.5 lb

## 2018-02-01 DIAGNOSIS — Z5181 Encounter for therapeutic drug level monitoring: Secondary | ICD-10-CM

## 2018-02-01 DIAGNOSIS — G35 Multiple sclerosis: Secondary | ICD-10-CM

## 2018-02-01 NOTE — Progress Notes (Signed)
Reason for visit: Multiple sclerosis  Shelby [name] is an 37 y.o. female  History of present illness:  Shelby [name] is a 37 year old left-handed white female with a history of multiple sclerosis.  The patient has been on Gilenya since diagnosis, she has not had any clinical MS exacerbations on the medication.  The patient last had MRI of the brain done in July 2017.  She comes in today for routine reevaluation.  She indicates that if she is particularly active, she may have some tingling sensations in the lower extremities below the knees, this gets better with rest.  The patient denies any new symptoms of numbness, weakness, gait disturbance, cognitive changes, or visual complaints.  The patient does report some urinary urgency which seems to be a new problem for her.  The patient recently had an upper respiratory tract infection, she is getting over this currently.  The patient has some underlying anxiety issues at times.  She is not being regularly followed through an ophthalmologist.  Past Medical History:  Diagnosis Date  . Anxiety   . Depression   . Hyperlipidemia   . Multiple sclerosis (HCC) 2012  . Obesity     Past Surgical History:  Procedure Laterality Date  . INTRAUTERINE DEVICE (IUD) INSERTION  07/2016  . WISDOM TOOTH EXTRACTION N/A 2003    Family History  Problem Relation Age of Onset  . Bipolar disorder Mother   . Schizophrenia Mother   . Early death Mother   . Heart attack Mother   . Throat cancer Father   . Early death Father   . Multiple sclerosis Maternal Grandmother   . Cancer Maternal Grandmother   . Breast cancer Paternal Grandmother   . Multiple sclerosis Paternal Grandfather   . Multiple sclerosis Maternal Aunt     Social history:  reports that  has never smoked. she has never used smokeless tobacco. She reports that she drinks alcohol. She reports that she does not use drugs.    Allergies  Allergen Reactions  . Bactrim  [Sulfamethoxazole-Trimethoprim] Anaphylaxis    Medications:  Prior to Admission medications   Medication Sig Start Date End Date Taking? Authorizing Provider  Fingolimod HCl (GILENYA) 0.5 MG CAPS TAKE ONE CAPSULE (0.5 MG) BY MOUTH ONCE DAILY. MAY TAKE WITH OR WITHOUT FOOD. STORE AT ROOM TEMPERATURE. 01/19/18   York Spaniel, MD  sertraline (ZOLOFT) 100 MG tablet TAKE 1 TABLET BY MOUTH EVERY DAY 11/22/17   York Spaniel, MD    ROS:  Out of a complete 14 system review of symptoms, the patient complains only of the following symptoms, and all other reviewed systems are negative.  Decreased activity Cough Daytime sleepiness Frequency of urination, urinary urgency Back pain, aching muscles, neck pain, neck stiffness Bruising easily, anemia Numbness Agitation, decreased concentration, depression, anxiety  Blood pressure 120/69, pulse 69, height 5\' 4"  (1.626 m), weight 137 lb 8 oz (62.4 kg).  Physical Exam  General: The patient is alert and cooperative at the time of the examination.  Skin: No significant peripheral edema is noted.   Neurologic Exam  Mental status: The patient is alert and oriented x 3 at the time of the examination. The patient has apparent normal recent and remote memory, with an apparently normal attention span and concentration ability.   Cranial nerves: Facial symmetry is present. Speech is normal, no aphasia or dysarthria is noted. Extraocular movements are full. Visual fields are full.  Pupils are equal, round, and reactive to light.  Discs are flat bilaterally.  Motor: The patient has good strength in all 4 extremities.  Sensory examination: Soft touch sensation is symmetric on the face, arms, and legs.  Coordination: The patient has good finger-nose-finger and heel-to-shin bilaterally.  Gait and station: The patient has a normal gait. Tandem gait is normal. Romberg is negative. No drift is seen.  Reflexes: Deep tendon reflexes are  symmetric.   Assessment/Plan:  1.  Multiple sclerosis  The patient appears to be doing quite well on Gilenya.  She will continue the medication for now.  We will check MRI of the brain on her next revisit.  She will have blood work done today.  I have encouraged the patient to follow-up regularly with an ophthalmologist.  Marlan Palau MD 02/01/2018 9:26 AM  [facility] [address] [place], [place] [address]  Phone [phone] Fax [phone]

## 2018-02-02 LAB — CBC WITH DIFFERENTIAL/PLATELET
BASOS: 1 %
Basophils Absolute: 0 10*3/uL (ref 0.0–0.2)
EOS (ABSOLUTE): 0.1 10*3/uL (ref 0.0–0.4)
EOS: 3 %
HEMOGLOBIN: 14.3 g/dL (ref 11.1–15.9)
Hematocrit: 43.8 % (ref 34.0–46.6)
IMMATURE GRANS (ABS): 0 10*3/uL (ref 0.0–0.1)
IMMATURE GRANULOCYTES: 1 %
LYMPHS: 18 %
Lymphocytes Absolute: 0.6 10*3/uL — ABNORMAL LOW (ref 0.7–3.1)
MCH: 29.4 pg (ref 26.6–33.0)
MCHC: 32.6 g/dL (ref 31.5–35.7)
MCV: 90 fL (ref 79–97)
MONOCYTES: 9 %
Monocytes Absolute: 0.3 10*3/uL (ref 0.1–0.9)
NEUTROS ABS: 2.2 10*3/uL (ref 1.4–7.0)
NEUTROS PCT: 68 %
PLATELETS: 238 10*3/uL (ref 150–379)
RBC: 4.86 x10E6/uL (ref 3.77–5.28)
RDW: 14 % (ref 12.3–15.4)
WBC: 3.2 10*3/uL — ABNORMAL LOW (ref 3.4–10.8)

## 2018-02-02 LAB — COMPREHENSIVE METABOLIC PANEL
A/G RATIO: 2.2 (ref 1.2–2.2)
ALT: 12 IU/L (ref 0–32)
AST: 12 IU/L (ref 0–40)
Albumin: 4.8 g/dL (ref 3.5–5.5)
Alkaline Phosphatase: 66 IU/L (ref 39–117)
BUN/Creatinine Ratio: 13 (ref 9–23)
BUN: 8 mg/dL (ref 6–20)
Bilirubin Total: 0.5 mg/dL (ref 0.0–1.2)
CALCIUM: 9.8 mg/dL (ref 8.7–10.2)
CO2: 25 mmol/L (ref 20–29)
CREATININE: 0.64 mg/dL (ref 0.57–1.00)
Chloride: 105 mmol/L (ref 96–106)
GFR calc Af Amer: 135 mL/min/{1.73_m2} (ref >59)
GFR, EST NON AFRICAN AMERICAN: 117 mL/min/{1.73_m2} (ref >59)
GLUCOSE: 97 mg/dL (ref 65–99)
Globulin, Total: 2.2 g/dL (ref 1.5–4.5)
POTASSIUM: 4.7 mmol/L (ref 3.5–5.2)
Sodium: 144 mmol/L (ref 134–144)
TOTAL PROTEIN: 7 g/dL (ref 6.0–8.5)

## 2018-02-21 ENCOUNTER — Telehealth: Payer: Self-pay | Admitting: Neurology

## 2018-02-21 DIAGNOSIS — G35 Multiple sclerosis: Secondary | ICD-10-CM

## 2018-02-21 NOTE — Addendum Note (Signed)
Addended by: York Spaniel on: 02/21/2018 01:25 PM   Modules accepted: Orders

## 2018-02-21 NOTE — Telephone Encounter (Signed)
I called the patient.  The patient over the last 4 days has noted onset of numbness affecting the right foot.  The patient has not had any change in strength or balance.  She denies issues controlling the bowels or the bladder, she has not had any vision changes.  She otherwise feels well, no increased fatigue.  It is possible this could be a small MS attack, I would not initiate prednisone currently.  We were planning on getting MRI evaluation of the brain on the next revisit, I will order the MRI now rather than waiting.  The patient is to remain on Gilenya.

## 2018-02-21 NOTE — Telephone Encounter (Signed)
[employer] order sent to GI. GI will obtain the auth and will reach out to the pt to schedule.

## 2018-02-21 NOTE — Telephone Encounter (Signed)
Pt states that on Thurs part of her foot started feeling numb but since the weekend and even now her entire foot is numb.  Pt states she is unsure if this is a possible MS flare up.  Please call

## 2018-02-27 ENCOUNTER — Other Ambulatory Visit: Payer: Self-pay | Admitting: Neurology

## 2018-03-01 ENCOUNTER — Ambulatory Visit
Admission: RE | Admit: 2018-03-01 | Discharge: 2018-03-01 | Disposition: A | Discharge status: Home / Self Care | Payer: Managed Care, Other (non HMO) | Source: Ambulatory Visit | Attending: Neurology | Admitting: Neurology

## 2018-03-01 ENCOUNTER — Telehealth: Payer: Self-pay | Admitting: Neurology

## 2018-03-01 DIAGNOSIS — G35 Multiple sclerosis: Secondary | ICD-10-CM

## 2018-03-01 MED ORDER — PREDNISONE 10 MG PO TABS: ORAL_TABLET | ORAL | 0 refills | Status: DC

## 2018-03-01 MED ORDER — GADOBENATE DIMEGLUMINE 529 MG/ML IV SOLN
13.0000 mL | Freq: Once | INTRAVENOUS | Status: AC | PRN
  Administered 2018-03-01: 13 mL via INTRAVENOUS

## 2018-03-01 NOTE — Telephone Encounter (Signed)
I called the patient.  MRI of the brain was unchanged from 2017.  The patient has had some progression of her sensory symptoms, both legs are now affected up to the knees, she feels slightly weak in the legs, no change in bowel or bladder control.  The patient likely has a small lower spinal cord lesion, this likely does represent an MS attack.  We will initiate treatment with prednisone.   MRI brain 03/01/18:  IMPRESSION: This MRI of the brain with and without contrast shows the following: 1.    Multiple T2/FLAIR hyperintense foci in the cerebellum and hemispheres in a pattern and configuration consistent with chronic demyelinating plaque associated with multiple sclerosis.  None of the foci appears to be acute.  When compared to the MRI dated 06/04/2016, there is no interval change. 2.    There is a normal enhancement pattern and there are no acute findings.

## 2018-03-08 ENCOUNTER — Encounter: Payer: Self-pay | Admitting: Neurology

## 2018-04-18 ENCOUNTER — Other Ambulatory Visit: Payer: Self-pay | Admitting: Neurology

## 2018-05-27 ENCOUNTER — Other Ambulatory Visit: Payer: Self-pay | Admitting: Neurology

## 2018-08-03 NOTE — Progress Notes (Signed)
[facility]  PATIENT: Shelby Shelby [name] DOB: [birth year]   REASON FOR VISIT: Follow-up for multiple sclerosis HISTORY FROM: Patient    HISTORY OF PRESENT ILLNESS:UPDATE 9/19/2019CM Shelby Shelby [name], 37 year old female returns for follow-up with history of relapsing remitting multiple sclerosis she has been on Gilenya since her diagnosis.  In April 2019 she had an episode of numbness affecting the right foot without change in her strength or balance.  She did not have any visual changes problems with controlling the bowel or bladder.  She was given a prednisone Dosepak with relief of those symptoms.  She then had MRI of the brain 03/01/2018  MRI of the brain with and without contrast shows the following: 1.    Multiple T2/FLAIR hyperintense foci in the cerebellum and hemispheres in a pattern and configuration consistent with chronic demyelinating plaque associated with multiple sclerosis.  None of the foci appears to be acute.  When compared to the MRI dated 06/04/2016, there is no interval change. 2.    There is a normal enhancement pattern and there are no acute findings. On return visit today she denies any weakness vision changes, speech or swallowing difficulty bowel or bladder difficulty.  She continues to exercise by walking.  She returns for reevaluation   3/19/19KWMs. Shelby [name] is a 37 year old left-handed white female with a history of multiple sclerosis.This    The patient has been on Gilenya since diagnosis, she has not had any clinical MS exacerbations on the medication.  The patient last had MRI of the brain done in July 2017.  She comes in today for routine reevaluation.  She indicates that if she is particularly active, she may have some tingling sensations in the lower extremities below the knees, this gets better with rest.  The patient denies any new symptoms of numbness, weakness, gait disturbance, cognitive changes, or visual complaints.  The patient does report  some urinary urgency which seems to be a new problem for her.  The patient recently had an upper respiratory tract infection, she is getting over this currently.  The patient has some underlying anxiety issues at times.  She is not being regularly followed through an ophthalmologist.   REVIEW OF SYSTEMS: Full 14 system review of systems performed and notable only for those listed, all others are neg:  Constitutional: neg  Cardiovascular: neg Ear/Nose/Throat: neg  Skin: neg Eyes: neg Respiratory: neg Gastroitestinal: neg  Hematology/Lymphatic: neg  Endocrine: neg Musculoskeletal: Neck stiffness Allergy/Immunology: neg Neurological: neg Psychiatric: neg Sleep : neg   ALLERGIES: Allergies  Allergen Reactions  . Bactrim [Sulfamethoxazole-Trimethoprim] Anaphylaxis    HOME MEDICATIONS: Outpatient Medications Prior to Visit  Medication Sig Dispense Refill  . Fingolimod HCl (GILENYA) 0.5 MG CAPS TAKE 1 CAPSULE BY MOUTH EVERY DAY MAY TAKE WITH OR WITHOUT FOOD. STORE AT ROOM TEMPERATURE 30 capsule 3  . sertraline (ZOLOFT) 100 MG tablet TAKE 1 TABLET BY MOUTH EVERY DAY 90 tablet 0  . predniSONE (DELTASONE) 10 MG tablet Begin taking 6 tablets daily, taper by one tablet every other day until off the medication. 42 tablet 0   No facility-administered medications prior to visit.     PAST MEDICAL HISTORY: Past Medical History:  Diagnosis Date  . Anxiety   . Depression   . Hyperlipidemia   . Multiple sclerosis (HCC) 2012  . Obesity     PAST SURGICAL HISTORY: Past Surgical History:  Procedure Laterality Date  . INTRAUTERINE DEVICE (IUD) INSERTION  07/2016  . WISDOM TOOTH EXTRACTION N/A 2003  FAMILY HISTORY: Family History  Problem Relation Age of Onset  . Bipolar disorder Mother   . Schizophrenia Mother   . Early death Mother   . Heart attack Mother   . Throat cancer Father   . Early death Father   . Multiple sclerosis Maternal Grandmother   . Cancer Maternal  Grandmother   . Breast cancer Paternal Grandmother   . Multiple sclerosis Paternal Grandfather   . Multiple sclerosis Maternal Aunt     SOCIAL HISTORY: Social History   Socioeconomic History  . Marital status: Married    Spouse name: Not on file  . Number of children: 0  . Years of education: HS  . Highest education level: Not on file  Occupational History  . Occupation: Museum/gallery conservator  . Financial resource strain: Not on file  . Food insecurity:    Worry: Not on file    Inability: Not on file  . Transportation needs:    Medical: Not on file    Non-medical: Not on file  Tobacco Use  . Smoking status: Never Smoker  . Smokeless tobacco: Never Used  Substance and Sexual Activity  . Alcohol use: Yes    Alcohol/week: 0.0 standard drinks    Comment: rare  . Drug use: No  . Sexual activity: Yes    Birth control/protection: Pill  Lifestyle  . Physical activity:    Days per week: Not on file    Minutes per session: Not on file  . Stress: Not on file  Relationships  . Social connections:    Talks on phone: Not on file    Gets together: Not on file    Attends religious service: Not on file    Active member of club or organization: Not on file    Attends meetings of clubs or organizations: Not on file    Relationship status: Not on file  . Intimate partner violence:    Fear of current or ex partner: Not on file    Emotionally abused: Not on file    Physically abused: Not on file    Forced sexual activity: Not on file  Other Topics Concern  . Not on file  Social History Narrative   Patient is left handed.   Patient lives at home with significant other   Patient drinks coffee, tea, soda daily     PHYSICAL EXAM  Vitals:   08/04/18 1016  BP: 120/69  Pulse: 72  Weight: 130 lb 6.4 oz (59.1 kg)  Height: 5\' 4"  (1.626 m)   Body mass index is 22.38 kg/m.  Generalized: Well developed, in no acute distress  Head: normocephalic and atraumatic,. Oropharynx  benign  Neck: Supple,   Musculoskeletal: No deformity   Neurological examination   Mentation: Alert oriented to time, place, history taking. Attention span and concentration appropriate. Recent and remote memory intact.  Follows all commands speech and language fluent.   Cranial nerve II-XII: Fundoscopic exam reveals flat  disc margins.Pupils were equal round reactive to light extraocular movements were full, visual field were full on confrontational test. Facial sensation and strength were normal. hearing was intact to finger rubbing bilaterally. Uvula tongue midline. head turning and shoulder shrug were normal and symmetric.Tongue protrusion into cheek strength was normal. Motor: normal bulk and tone, full strength in the BUE, BLE,  Sensory: normal and symmetric to light touch, pinprick, and  Vibration, in the upper and lower extremities Coordination: finger-nose-finger, heel-to-shin bilaterally, no dysmetria Reflexes: Symmetric upper and lower plantar  responses were flexor bilaterally. Gait and Station: Rising up from seated position without assistance, normal stance,  moderate stride, good arm swing, smooth turning, able to perform tiptoe, and heel walking without difficulty. Tandem gait is steady  DIAGNOSTIC DATA (LABS, IMAGING, TESTING) - I reviewed patient records, labs, notes, testing and imaging myself where available.  Lab Results  Component Value Date   WBC 3.2 (L) 02/01/2018   HGB 14.3 02/01/2018   HCT 43.8 02/01/2018   MCV 90 02/01/2018   PLT 238 02/01/2018      Component Value Date/Time   NA 144 02/01/2018 0941   K 4.7 02/01/2018 0941   CL 105 02/01/2018 0941   CO2 25 02/01/2018 0941   GLUCOSE 97 02/01/2018 0941   BUN 8 02/01/2018 0941   CREATININE 0.64 02/01/2018 0941   CALCIUM 9.8 02/01/2018 0941   PROT 7.0 02/01/2018 0941   ALBUMIN 4.8 02/01/2018 0941   AST 12 02/01/2018 0941   ALT 12 02/01/2018 0941   ALKPHOS 66 02/01/2018 0941   BILITOT 0.5 02/01/2018 0941     GFRNONAA 117 02/01/2018 0941   GFRAA 135 02/01/2018 0941   Lab Results  Component Value Date   CHOL 254 (H) 07/15/2016   HDL 57 07/15/2016   LDLCALC 172 (H) 07/15/2016   TRIG 126 07/15/2016   CHOLHDL 4.5 07/15/2016    ASSESSMENT AND PLAN  36 y.o. year old female here to follow-up for multiple sclerosis relapsing remitting with one exacerbation since last seen symptoms resolved with prednisone Dosepak MRI of the brain 03/01/2018  MRI of the brain with and without contrast shows the following: 1.    Multiple T2/FLAIR hyperintense foci in the cerebellum and hemispheres in a pattern and configuration consistent with chronic demyelinating plaque associated with multiple sclerosis.  None of the foci appears to be acute.  When compared to the MRI dated 06/04/2016, there is no interval change. 2.    There is a normal enhancement pattern and there are no acute findings.     Continue Gilenya at current dose Will check CBC and CMP today for adverse effects Continue to follow-up regularly with ophthalmologist Follow-up here in 6 months Nilda Riggs, [facility], [facility], APRN  [facility] [address], [address] [place], [place] [address] [phone]

## 2018-08-04 ENCOUNTER — Ambulatory Visit (INDEPENDENT_AMBULATORY_CARE_PROVIDER_SITE_OTHER): Payer: Managed Care, Other (non HMO) | Admitting: Nurse Practitioner

## 2018-08-04 ENCOUNTER — Encounter: Payer: Self-pay | Admitting: Nurse Practitioner

## 2018-08-04 VITALS — BP 120/69 | HR 72 | Ht 64.0 in | Wt 130.4 lb

## 2018-08-04 DIAGNOSIS — G35 Multiple sclerosis: Secondary | ICD-10-CM

## 2018-08-04 DIAGNOSIS — Z5181 Encounter for therapeutic drug level monitoring: Secondary | ICD-10-CM

## 2018-08-04 MED ORDER — FINGOLIMOD HCL 0.5 MG PO CAPS: ORAL_CAPSULE | ORAL | 6 refills | Status: DC

## 2018-08-04 NOTE — Patient Instructions (Signed)
Continue Gilenya at current dose Will check CBC and CMP today for adverse effects Continue to follow-up regularly with ophthalmologist Follow-up here in 6 months

## 2018-08-04 NOTE — Progress Notes (Signed)
I have read the note, and I agree with the clinical assessment and plan.  Shelby [name]   

## 2018-08-05 ENCOUNTER — Telehealth: Payer: Self-pay | Admitting: *Deleted

## 2018-08-05 LAB — COMPREHENSIVE METABOLIC PANEL
A/G RATIO: 2.5 — AB (ref 1.2–2.2)
ALT: 16 IU/L (ref 0–32)
AST: 16 IU/L (ref 0–40)
Albumin: 4.7 g/dL (ref 3.5–5.5)
Alkaline Phosphatase: 63 IU/L (ref 39–117)
BILIRUBIN TOTAL: 0.5 mg/dL (ref 0.0–1.2)
BUN/Creatinine Ratio: 16 (ref 9–23)
BUN: 10 mg/dL (ref 6–20)
CALCIUM: 9.7 mg/dL (ref 8.7–10.2)
CHLORIDE: 102 mmol/L (ref 96–106)
CO2: 24 mmol/L (ref 20–29)
Creatinine, Ser: 0.62 mg/dL (ref 0.57–1.00)
GFR, EST AFRICAN AMERICAN: 136 mL/min/{1.73_m2} (ref >59)
GFR, EST NON AFRICAN AMERICAN: 118 mL/min/{1.73_m2} (ref >59)
GLOBULIN, TOTAL: 1.9 g/dL (ref 1.5–4.5)
Glucose: 83 mg/dL (ref 65–99)
POTASSIUM: 4.6 mmol/L (ref 3.5–5.2)
SODIUM: 140 mmol/L (ref 134–144)
TOTAL PROTEIN: 6.6 g/dL (ref 6.0–8.5)

## 2018-08-05 LAB — CBC WITH DIFFERENTIAL/PLATELET
BASOS ABS: 0 10*3/uL (ref 0.0–0.2)
BASOS: 1 %
EOS (ABSOLUTE): 0.1 10*3/uL (ref 0.0–0.4)
Eos: 2 %
Hematocrit: 41.7 % (ref 34.0–46.6)
Hemoglobin: 13.7 g/dL (ref 11.1–15.9)
Immature Grans (Abs): 0 10*3/uL (ref 0.0–0.1)
Immature Granulocytes: 1 %
LYMPHS ABS: 0.6 10*3/uL — AB (ref 0.7–3.1)
LYMPHS: 15 %
MCH: 29.2 pg (ref 26.6–33.0)
MCHC: 32.9 g/dL (ref 31.5–35.7)
MCV: 89 fL (ref 79–97)
Monocytes Absolute: 0.3 10*3/uL (ref 0.1–0.9)
Monocytes: 9 %
NEUTROS ABS: 2.9 10*3/uL (ref 1.4–7.0)
Neutrophils: 72 %
PLATELETS: 261 10*3/uL (ref 150–450)
RBC: 4.69 x10E6/uL (ref 3.77–5.28)
RDW: 12.2 % — ABNORMAL LOW (ref 12.3–15.4)
WBC: 3.9 10*3/uL (ref 3.4–10.8)

## 2018-08-05 NOTE — Telephone Encounter (Signed)
-----   Message from Nilda Riggs, NP sent at 08/05/2018 10:44 AM EDT ----- Labs stable please call the patient

## 2018-08-05 NOTE — Telephone Encounter (Signed)
LMVM for pt that her lab results stable.  She is to call back if quesitons.

## 2018-08-28 ENCOUNTER — Other Ambulatory Visit: Payer: Self-pay | Admitting: Neurology

## 2018-12-09 ENCOUNTER — Telehealth: Payer: Self-pay | Admitting: Nurse Practitioner

## 2018-12-09 MED ORDER — FINGOLIMOD HCL 0.5 MG PO CAPS: ORAL_CAPSULE | ORAL | 6 refills | Status: DC

## 2018-12-09 NOTE — Telephone Encounter (Signed)
Pt is in need of a refill for her Fingolimod HCl (GILENYA) 0.5 MG CAPS sent to [facility]  She states she has new ins.:  [employer] ID [id number] Group# [id number] RX BIN# [id number]

## 2018-12-09 NOTE — Telephone Encounter (Signed)
Spoke to pt and relayed that Gilenya would come thru specialty pharmacy.  [facility] is her new insurance.  I relayed [facility].  She verbalized understanding.

## 2019-01-02 ENCOUNTER — Other Ambulatory Visit: Payer: Self-pay | Admitting: Neurology

## 2019-02-02 ENCOUNTER — Ambulatory Visit: Payer: Managed Care, Other (non HMO) | Admitting: Nurse Practitioner

## 2019-03-29 ENCOUNTER — Other Ambulatory Visit: Payer: Self-pay | Admitting: Neurology

## 2019-05-11 ENCOUNTER — Telehealth: Payer: Self-pay

## 2019-05-11 NOTE — Telephone Encounter (Signed)
PA for Gilenya has been faxed to [place] [place].  Fax # [phone], confirmation received.

## 2019-05-16 NOTE — Telephone Encounter (Signed)
Late entry~ PA for Gilenya was approved on 05/11/19 effective through 05/10/2020.

## 2019-06-29 ENCOUNTER — Other Ambulatory Visit: Payer: Self-pay | Admitting: Neurology

## 2019-09-25 ENCOUNTER — Other Ambulatory Visit: Payer: Self-pay | Admitting: Neurology

## 2019-10-04 ENCOUNTER — Other Ambulatory Visit: Payer: Self-pay

## 2019-10-16 NOTE — Progress Notes (Signed)
PATIENT: Shelby [name] DOB: Feb 10, 1981  REASON FOR VISIT: follow up HISTORY FROM: patient  HISTORY OF PRESENT ILLNESS: Today 10/17/19  Shelby [name] is a 38 year old female with history of relapsing remitting multiple sclerosis.  She had been on Gilenya since her diagnosis.  In April 2019 she had an exacerbation of numbness affecting the right foot.  She was treated with a prednisone Dosepak resulting in relief of symptoms.  She had MRI of the brain in April 2019, that did not show any change compared to prior in July 2017.  On review of the MRI by Dr. Anne Hahn, it was felt that the patient may likely have a small lower spinal cord lesion, based on her symptoms.  She indicates she has continued to do well.  She denies any new numbness or weakness to her arms or legs.  She denies any changes to her vision.  She does report that exertion makes her feel fatigued.  She denies urinary or bowel incontinence, but she may have urinary urgency.  She denies any changes to her walking or balance.  She has not had any falls.  She remains on Gilenya and Zoloft from this office.  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  She has not been seen in the office since September 2019.  She presents today for follow-up.  HISTORY  HISTORY OF PRESENT ILLNESS:UPDATE 9/19/2019CM Shelby [name], 38 year old female returns for follow-up with history of relapsing remitting multiple sclerosis she has been on Gilenya since her diagnosis.  In April 2019 she had an episode of numbness affecting the right foot without change in her strength or balance.  She did not have any visual changes problems with controlling the bowel or bladder.  She was given a prednisone Dosepak with relief of those symptoms.  She then had MRI of the brain 03/01/2018  MRI of the brain with and without contrast shows the following: 1. Multiple T2/FLAIR hyperintense foci in the cerebellum and hemispheres in a pattern and configuration consistent with chronic  demyelinating plaque associated with multiple sclerosis. None of the foci appears to be acute. When compared to the MRI dated 06/04/2016, there is no interval change. 2. There is a normal enhancement pattern and there are no acute findings. On return visit today she denies any weakness vision changes, speech or swallowing difficulty bowel or bladder difficulty.  She continues to exercise by walking.  She returns for reevaluation  REVIEW OF SYSTEMS: Out of a complete 14 system review of symptoms, the patient complains only of the following symptoms, and all other reviewed systems are negative.  Urgency, back pain, neck stiffness, dizziness, weakness, depression, nervous/anxious  ALLERGIES: Allergies  Allergen Reactions  . Bactrim [Sulfamethoxazole-Trimethoprim] Anaphylaxis    HOME MEDICATIONS: Outpatient Medications Prior to Visit  Medication Sig Dispense Refill  . Fingolimod HCl (GILENYA) 0.5 MG CAPS TAKE 1 CAPSULE BY MOUTH EVERY DAY MAY TAKE WITH OR WITHOUT FOOD. STORE AT ROOM TEMPERATURE 30 capsule 6  . sertraline (ZOLOFT) 100 MG tablet TAKE 1 TABLET BY MOUTH EVERY DAY 90 tablet 0   No facility-administered medications prior to visit.     PAST MEDICAL HISTORY: Past Medical History:  Diagnosis Date  . Anxiety   . Depression   . Hyperlipidemia   . Multiple sclerosis (HCC) 2012  . Obesity     PAST SURGICAL HISTORY: Past Surgical History:  Procedure Laterality Date  . INTRAUTERINE DEVICE (IUD) INSERTION  07/2016  . WISDOM TOOTH EXTRACTION N/A 2003    FAMILY HISTORY:  Family History  Problem Relation Age of Onset  . Bipolar disorder Mother   . Schizophrenia Mother   . Early death Mother   . Heart attack Mother   . Throat cancer Father   . Early death Father   . Multiple sclerosis Maternal Grandmother   . Cancer Maternal Grandmother   . Breast cancer Paternal Grandmother   . Multiple sclerosis Paternal Grandfather   . Multiple sclerosis Maternal Aunt     SOCIAL  HISTORY: Social History   Socioeconomic History  . Marital status: Married    Spouse name: Not on file  . Number of children: 0  . Years of education: HS  . Highest education level: Not on file  Occupational History  . Occupation: Doctor, hospital  . Financial resource strain: Not on file  . Food insecurity    Worry: Not on file    Inability: Not on file  . Transportation needs    Medical: Not on file    Non-medical: Not on file  Tobacco Use  . Smoking status: Never Smoker  . Smokeless tobacco: Never Used  Substance and Sexual Activity  . Alcohol use: Yes    Alcohol/week: 0.0 standard drinks    Comment: rare  . Drug use: No  . Sexual activity: Yes    Birth control/protection: Pill  Lifestyle  . Physical activity    Days per week: Not on file    Minutes per session: Not on file  . Stress: Not on file  Relationships  . Social Herbalist on phone: Not on file    Gets together: Not on file    Attends religious service: Not on file    Active member of club or organization: Not on file    Attends meetings of clubs or organizations: Not on file    Relationship status: Not on file  . Intimate partner violence    Fear of current or ex partner: Not on file    Emotionally abused: Not on file    Physically abused: Not on file    Forced sexual activity: Not on file  Other Topics Concern  . Not on file  Social History Narrative   Patient is left handed.   Patient lives at home with significant other   Patient drinks coffee, tea, soda daily    PHYSICAL EXAM  Vitals:   10/17/19 0734  BP: (!) 106/59  Pulse: 70  Temp: (!) 96.9 F (36.1 C)  TempSrc: Oral  Weight: 149 lb 6.4 oz (67.8 kg)  Height: 5\' 4"  (1.626 m)   Body mass index is 25.64 kg/m.  Generalized: Well developed, in no acute distress   Neurological examination  Mentation: Alert oriented to time, place, history taking. Follows all commands speech and language fluent Cranial nerve II-XII:  Pupils were equal round reactive to light. Extraocular movements were full, visual field were full on confrontational test. Facial sensation and strength were normal. Head turning and shoulder shrug  were normal and symmetric. Motor: The motor testing reveals 5 over 5 strength of all 4 extremities. Good symmetric motor tone is noted throughout.  Sensory: Sensory testing is intact to soft touch on all 4 extremities. No evidence of extinction is noted.  Coordination: Cerebellar testing reveals good finger-nose-finger and heel-to-shin bilaterally.  Gait and station: Gait is normal. Tandem gait is normal. Romberg is negative. No drift is seen.  Reflexes: Deep tendon reflexes are symmetric and normal bilaterally.   DIAGNOSTIC DATA (LABS, IMAGING,  TESTING) - I reviewed patient records, labs, notes, testing and imaging myself where available.  Lab Results  Component Value Date   WBC 3.9 08/04/2018   HGB 13.7 08/04/2018   HCT 41.7 08/04/2018   MCV 89 08/04/2018   PLT 261 08/04/2018      Component Value Date/Time   NA 140 08/04/2018 1035   K 4.6 08/04/2018 1035   CL 102 08/04/2018 1035   CO2 24 08/04/2018 1035   GLUCOSE 83 08/04/2018 1035   BUN 10 08/04/2018 1035   CREATININE 0.62 08/04/2018 1035   CALCIUM 9.7 08/04/2018 1035   PROT 6.6 08/04/2018 1035   ALBUMIN 4.7 08/04/2018 1035   AST 16 08/04/2018 1035   ALT 16 08/04/2018 1035   ALKPHOS 63 08/04/2018 1035   BILITOT 0.5 08/04/2018 1035   GFRNONAA 118 08/04/2018 1035   GFRAA 136 08/04/2018 1035   Lab Results  Component Value Date   CHOL 254 (H) 07/15/2016   HDL 57 07/15/2016   LDLCALC 172 (H) 07/15/2016   TRIG 126 07/15/2016   CHOLHDL 4.5 07/15/2016   No results found for: HGBA1C No results found for: VITAMINB12 No results found for: TSH  ASSESSMENT AND PLAN 37 y.o. year old female  has a past medical history of Anxiety, Depression, Hyperlipidemia, Multiple sclerosis (HCC) (2012), and Obesity. here with:  1.  Relapsing  remitting multiple sclerosis 2.  Depression/Anxiety  Overall, she has continued to do well.  She suffered an exacerbation in April 2019, described as sensory symptoms in both legs up to her knees.  MRI of the brain was unchanged from prior in 2017. Dr. Clarisa Kindred note, indicates she likely has a small lower spinal cord lesion.  She will remain on Gilenya and Zoloft.  We discussed repeating MRI of the brain, and possibly getting MRI of cervical spine. For now, we will plan on getting at next visit.  I will check routine lab work today.  She will schedule an annual visit with her eye doctor while on Gilenya. She will follow-up in 6 months or sooner if needed.  I did advise if her symptoms worsen or she develops any new symptoms she should let us know.  I spent 25 minutes with the patient. 50% of this time was spent discussing her plan of care.  Margie Ege, AGNP-C, DNP 10/17/2019, 7:40 AM [facility] [address], [address] [place], [place] [address] [phone]

## 2019-10-17 ENCOUNTER — Other Ambulatory Visit: Payer: Self-pay

## 2019-10-17 ENCOUNTER — Encounter: Payer: Self-pay | Admitting: Neurology

## 2019-10-17 ENCOUNTER — Ambulatory Visit: Payer: BLUE CROSS/BLUE SHIELD | Admitting: Neurology

## 2019-10-17 VITALS — BP 106/59 | HR 70 | Temp 96.9°F | Ht 64.0 in | Wt 149.4 lb

## 2019-10-17 DIAGNOSIS — G35 Multiple sclerosis: Secondary | ICD-10-CM

## 2019-10-17 DIAGNOSIS — F418 Other specified anxiety disorders: Secondary | ICD-10-CM | POA: Diagnosis not present

## 2019-10-17 MED ORDER — SERTRALINE HCL 100 MG PO TABS: 100.0000 mg | ORAL_TABLET | Freq: Every day | ORAL | 1 refills | Status: DC

## 2019-10-17 MED ORDER — GILENYA 0.5 MG PO CAPS: ORAL_CAPSULE | ORAL | 6 refills | Status: DC

## 2019-10-17 NOTE — Progress Notes (Signed)
I have read the note, and I agree with the clinical assessment and plan.  Tanise Russman K Rasaan Brotherton   

## 2019-10-17 NOTE — Patient Instructions (Signed)
I will order lab work today   Continue Gilenya, Zoloft   At next visit we will recheck MRI   Return in 6 months   Please follow-up with eye doctor for yearly evaluation while on Gilenya

## 2019-10-18 ENCOUNTER — Telehealth: Payer: Self-pay

## 2019-10-18 LAB — CBC WITH DIFFERENTIAL/PLATELET
Basophils Absolute: 0 10*3/uL (ref 0.0–0.2)
Basos: 1 %
EOS (ABSOLUTE): 0.1 10*3/uL (ref 0.0–0.4)
Eos: 3 %
Hematocrit: 41.4 % (ref 34.0–46.6)
Hemoglobin: 13.5 g/dL (ref 11.1–15.9)
Immature Grans (Abs): 0 10*3/uL (ref 0.0–0.1)
Immature Granulocytes: 1 %
Lymphocytes Absolute: 0.7 10*3/uL (ref 0.7–3.1)
Lymphs: 14 %
MCH: 29.7 pg (ref 26.6–33.0)
MCHC: 32.6 g/dL (ref 31.5–35.7)
MCV: 91 fL (ref 79–97)
Monocytes Absolute: 0.4 10*3/uL (ref 0.1–0.9)
Monocytes: 9 %
Neutrophils Absolute: 3.8 10*3/uL (ref 1.4–7.0)
Neutrophils: 72 %
Platelets: 209 10*3/uL (ref 150–450)
RBC: 4.54 x10E6/uL (ref 3.77–5.28)
RDW: 12.3 % (ref 11.7–15.4)
WBC: 5.2 10*3/uL (ref 3.4–10.8)

## 2019-10-18 LAB — COMPREHENSIVE METABOLIC PANEL
ALT: 15 IU/L (ref 0–32)
AST: 15 IU/L (ref 0–40)
Albumin/Globulin Ratio: 2.8 — ABNORMAL HIGH (ref 1.2–2.2)
Albumin: 4.7 g/dL (ref 3.8–4.8)
Alkaline Phosphatase: 73 IU/L (ref 39–117)
BUN/Creatinine Ratio: 13 (ref 9–23)
BUN: 9 mg/dL (ref 6–20)
Bilirubin Total: 0.3 mg/dL (ref 0.0–1.2)
CO2: 25 mmol/L (ref 20–29)
Calcium: 9.5 mg/dL (ref 8.7–10.2)
Chloride: 106 mmol/L (ref 96–106)
Creatinine, Ser: 0.68 mg/dL (ref 0.57–1.00)
GFR calc Af Amer: 130 mL/min/{1.73_m2} (ref >59)
GFR calc non Af Amer: 113 mL/min/{1.73_m2} (ref >59)
Globulin, Total: 1.7 g/dL (ref 1.5–4.5)
Glucose: 85 mg/dL (ref 65–99)
Potassium: 4.3 mmol/L (ref 3.5–5.2)
Sodium: 142 mmol/L (ref 134–144)
Total Protein: 6.4 g/dL (ref 6.0–8.5)

## 2019-10-18 NOTE — Telephone Encounter (Signed)
-----   Message from Suzzanne Cloud, NP sent at 10/18/2019 10:01 AM EST ----- Please call the patient. Labs look good while on Gilenya.

## 2019-10-18 NOTE — Telephone Encounter (Signed)
Spoke with the patient and she verbalized understanding her results. No questions or concerns at this time.   

## 2020-04-08 ENCOUNTER — Other Ambulatory Visit: Payer: Self-pay | Admitting: Neurology

## 2020-04-16 ENCOUNTER — Encounter: Payer: Self-pay | Admitting: Neurology

## 2020-04-16 ENCOUNTER — Other Ambulatory Visit: Payer: Self-pay

## 2020-04-16 ENCOUNTER — Ambulatory Visit: Payer: BLUE CROSS/BLUE SHIELD | Admitting: Neurology

## 2020-04-16 VITALS — BP 108/65 | HR 68 | Ht 63.5 in | Wt 149.0 lb

## 2020-04-16 DIAGNOSIS — R5383 Other fatigue: Secondary | ICD-10-CM | POA: Diagnosis not present

## 2020-04-16 DIAGNOSIS — G35 Multiple sclerosis: Secondary | ICD-10-CM

## 2020-04-16 DIAGNOSIS — F418 Other specified anxiety disorders: Secondary | ICD-10-CM | POA: Diagnosis not present

## 2020-04-16 MED ORDER — GILENYA 0.5 MG PO CAPS: ORAL_CAPSULE | ORAL | 11 refills | Status: DC

## 2020-04-16 MED ORDER — SERTRALINE HCL 100 MG PO TABS: 100.0000 mg | ORAL_TABLET | Freq: Every day | ORAL | 3 refills | Status: DC

## 2020-04-16 MED ORDER — MODAFINIL 100 MG PO TABS
100.0000 mg | ORAL_TABLET | Freq: Every morning | ORAL | 3 refills | Status: DC
Start: 2020-04-16 — End: 2020-10-17

## 2020-04-16 NOTE — Progress Notes (Signed)
PATIENT: Shelby [name] DOB: November 05, 1981  REASON FOR VISIT: follow up HISTORY FROM: patient  HISTORY OF PRESENT ILLNESS: Today 04/16/20  Shelby [name] is a 39 year old female with history of relapsing remitting multiple sclerosis.  She has been on Gilenya since her diagnosis.  She had exacerbation of numbness affecting the right side in April 2019, treated with prednisone Dosepak, resulting in relief of symptoms.  MRI of the brain in April 2019 did not show any change compared to prior in July 2017.  Dr. Patrecia Pace reviewed MRI, it was felt the patient may likely have a small lower spinal cord lesion based on her symptoms.  Has remained stable since last seen.  She continues to have fatigue daily, previously has had several sleep studies, and have been normal.  She may have intermittent numbness in her feet.  No falls, but she may trip.  She has urgency with urination.  No changes to the vision, has not had ophthalmology evaluation of recent.  Reports a few episodes of dizziness.  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  She has several animals, stays busy caring for them.  Does not have regular PCP follow-up.  Presents today for evaluation unaccompanied.  HISTORY  10/17/2019 SS: Shelby [name] is a 39 year old female with history of relapsing remitting multiple sclerosis.  She had been on Gilenya since her diagnosis.  In April 2019 she had an exacerbation of numbness affecting the right foot.  She was treated with a prednisone Dosepak resulting in relief of symptoms.  She had MRI of the brain in April 2019, that did not show any change compared to prior in July 2017.  On review of the MRI by Dr. Anne Hahn, it was felt that the patient may likely have a small lower spinal cord lesion, based on her symptoms.  She indicates she has continued to do well.  She denies any new numbness or weakness to her arms or legs.  She denies any changes to her vision.  She does report that exertion makes her feel  fatigued.  She denies urinary or bowel incontinence, but she may have urinary urgency.  She denies any changes to her walking or balance.  She has not had any falls.  She remains on Gilenya and Zoloft from this office.  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  She has not been seen in the office since September 2019.  She presents today for follow-up.  REVIEW OF SYSTEMS: Out of a complete 14 system review of symptoms, the patient complains only of the following symptoms, and all other reviewed systems are negative.  Numbness, fatigue  ALLERGIES: Allergies  Allergen Reactions  . Bactrim [Sulfamethoxazole-Trimethoprim] Anaphylaxis    HOME MEDICATIONS: Outpatient Medications Prior to Visit  Medication Sig Dispense Refill  . Fingolimod HCl (GILENYA) 0.5 MG CAPS TAKE 1 CAPSULE BY MOUTH EVERY DAY MAY TAKE WITH OR WITHOUT FOOD. STORE AT ROOM TEMPERATURE 30 capsule 6  . sertraline (ZOLOFT) 100 MG tablet TAKE 1 TABLET BY MOUTH EVERY DAY 90 tablet 1   No facility-administered medications prior to visit.    PAST MEDICAL HISTORY: Past Medical History:  Diagnosis Date  . Anxiety   . Depression   . Hyperlipidemia   . Multiple sclerosis (HCC) 2012  . Obesity     PAST SURGICAL HISTORY: Past Surgical History:  Procedure Laterality Date  . INTRAUTERINE DEVICE (IUD) INSERTION  07/2016  . WISDOM TOOTH EXTRACTION N/A 2003    FAMILY HISTORY: Family History  Problem Relation  Age of Onset  . Bipolar disorder Mother   . Schizophrenia Mother   . Early death Mother   . Heart attack Mother   . Throat cancer Father   . Early death Father   . Multiple sclerosis Maternal Grandmother   . Cancer Maternal Grandmother   . Breast cancer Paternal Grandmother   . Multiple sclerosis Paternal Grandfather   . Multiple sclerosis Maternal Aunt     SOCIAL HISTORY: Social History   Socioeconomic History  . Marital status: Married    Spouse name: Not on file  . Number of children: 0  . Years of education:  HS  . Highest education level: Not on file  Occupational History  . Occupation: Chief Financial Officer  Tobacco Use  . Smoking status: Never Smoker  . Smokeless tobacco: Never Used  Substance and Sexual Activity  . Alcohol use: Yes    Alcohol/week: 0.0 standard drinks    Comment: rare  . Drug use: No  . Sexual activity: Yes    Birth control/protection: Pill  Other Topics Concern  . Not on file  Social History Narrative   Patient is left handed.   Patient lives at home with significant other   Patient drinks coffee, tea, soda daily    Social Determinants of Health   Financial Resource Strain:   . Difficulty of Paying Living Expenses:   Food Insecurity:   . Worried About Programme researcher, broadcasting/film/video in the Last Year:   . Barista in the Last Year:   Transportation Needs:   . Freight forwarder (Medical):   Marland Kitchen Lack of Transportation (Non-Medical):   Physical Activity:   . Days of Exercise per Week:   . Minutes of Exercise per Session:   Stress:   . Feeling of Stress :   Social Connections:   . Frequency of Communication with Friends and Family:   . Frequency of Social Gatherings with Friends and Family:   . Attends Religious Services:   . Active Member of Clubs or Organizations:   . Attends Banker Meetings:   Marland Kitchen Marital Status:   Intimate Partner Violence:   . Fear of Current or Ex-Partner:   . Emotionally Abused:   Marland Kitchen Physically Abused:   . Sexually Abused:    PHYSICAL EXAM  Vitals:   04/16/20 0856  BP: 108/65  Pulse: 68  Weight: 149 lb (67.6 kg)  Height: 5' 3.5" (1.613 m)   Body mass index is 25.98 kg/m.  Generalized: Well developed, in no acute distress   Neurological examination  Mentation: Alert oriented to time, place, history taking. Follows all commands speech and language fluent Cranial nerve II-XII: Pupils were equal round reactive to light. Extraocular movements were full, visual field were full on confrontational test. Facial sensation and  strength were normal. Head turning and shoulder shrug  were normal and symmetric. Motor: The motor testing reveals 5 over 5 strength of all 4 extremities. Good symmetric motor tone is noted throughout.  Sensory: Sensory testing is intact to soft touch on all 4 extremities. No evidence of extinction is noted.  Coordination: Cerebellar testing reveals good finger-nose-finger and heel-to-shin bilaterally.  Gait and station: Gait is normal. Tandem gait is normal. Romberg is negative. No drift is seen.  Reflexes: Deep tendon reflexes are symmetric and normal bilaterally.   DIAGNOSTIC DATA (LABS, IMAGING, TESTING) - I reviewed patient records, labs, notes, testing and imaging myself where available.  Lab Results  Component Value Date   WBC  5.2 10/17/2019   HGB 13.5 10/17/2019   HCT 41.4 10/17/2019   MCV 91 10/17/2019   PLT 209 10/17/2019      Component Value Date/Time   NA 142 10/17/2019 0832   K 4.3 10/17/2019 0832   CL 106 10/17/2019 0832   CO2 25 10/17/2019 0832   GLUCOSE 85 10/17/2019 0832   BUN 9 10/17/2019 0832   CREATININE 0.68 10/17/2019 0832   CALCIUM 9.5 10/17/2019 0832   PROT 6.4 10/17/2019 0832   ALBUMIN 4.7 10/17/2019 0832   AST 15 10/17/2019 0832   ALT 15 10/17/2019 0832   ALKPHOS 73 10/17/2019 0832   BILITOT 0.3 10/17/2019 0832   GFRNONAA 113 10/17/2019 0832   GFRAA 130 10/17/2019 0832   Lab Results  Component Value Date   CHOL 254 (H) 07/15/2016   HDL 57 07/15/2016   LDLCALC 172 (H) 07/15/2016   TRIG 126 07/15/2016   CHOLHDL 4.5 07/15/2016   No results found for: HGBA1C No results found for: VITAMINB12 No results found for: TSH    ASSESSMENT AND PLAN 39 y.o. year old female  has a past medical history of Anxiety, Depression, Hyperlipidemia, Multiple sclerosis ([place]) (2012), and Obesity. here with:  1.  Relapsing remitting multiple sclerosis 2.  Fatigue 3.  Depression  Overall, Shelby [name] has remained stable.  She continues to have difficulty with  chronic fatigue.  We will do a trial of Provigil 100 mg daily.  She will remain on Zoloft and Gilenya.  I will order repeat MRI of the brain and cervical spine (check cervical spine exacerbation in 2019, felt likely small lower spinal cord lesion).  I will check CBC with differential, CMP, and TSH.  I will refer her for ophthalmology evaluation due to chronic Gilenya use, also PCP.  She will follow-up in 6 months or sooner if needed.  I spent 30 minutes of face-to-face and non-face-to-face time with patient.  This included previsit chart review, lab review, study review, order entry, electronic health record documentation, patient education.  Butler Denmark, AGNP-C, DNP 04/16/2020, 9:05 AM [facility] [address], [place],  [address] [phone]

## 2020-04-16 NOTE — Patient Instructions (Addendum)
Get MRI's Check blood work today Continue current medications Try Provigil for MS fatigue, 100 mg in the morning Referral to primary care, eye doctor See you back in 6 months   Modafinil tablets What is this medicine? MODAFINIL (moe DAF i nil) is used to treat excessive sleepiness caused by certain sleep disorders. This includes narcolepsy, sleep apnea, and shift work sleep disorder. This medicine may be used for other purposes; ask your health care provider or pharmacist if you have questions. COMMON BRAND NAME(S): Provigil What should I tell my health care provider before I take this medicine? They need to know if you have any of these conditions:  history of depression, mania, or other mental disorder  kidney disease  liver disease  an unusual or allergic reaction to modafinil, other medicines, foods, dyes, or preservatives  pregnant or trying to get pregnant  breast-feeding How should I use this medicine? Take this medicine by mouth with a glass of water. Follow the directions on the prescription label. Take your doses at regular intervals. Do not take your medicine more often than directed. Do not stop taking except on your doctor's advice. A special MedGuide will be given to you by the pharmacist with each prescription and refill. Be sure to read this information carefully each time. Talk to your pediatrician regarding the use of this medicine in children. This medicine is not approved for use in children. Overdosage: If you think you have taken too much of this medicine contact a poison control center or emergency room at once. NOTE: This medicine is only for you. Do not share this medicine with others. What if I miss a dose? If you miss a dose, take it as soon as you can. If it is almost time for your next dose, take only that dose. Do not take double or extra doses. What may interact with this medicine? Do not take this medicine with any of the following  medications:  amphetamine or dextroamphetamine  dexmethylphenidate or methylphenidate  medicines called MAO Inhibitors like Nardil, Parnate, Marplan, Eldepryl  pemoline  procarbazine This medicine may also interact with the following medications:  antifungal medicines like itraconazole or ketoconazole  barbiturates like phenobarbital  birth control pills or other hormone-containing birth control devices or implants  carbamazepine  cyclosporine  diazepam  medicines for depression, anxiety, or psychotic disturbances  phenytoin  propranolol  triazolam  warfarin This list may not describe all possible interactions. Give your health care provider a list of all the medicines, herbs, non-prescription drugs, or dietary supplements you use. Also tell them if you smoke, drink alcohol, or use illegal drugs. Some items may interact with your medicine. What should I watch for while using this medicine? Visit your doctor or health care provider for regular checks on your progress. The full effects of this medicine may not be seen right away. This medicine may cause serious skin reactions. They can happen weeks to months after starting the medicine. Contact your health care provider right away if you notice fevers or flu-like symptoms with a rash. The rash may be red or purple and then turn into blisters or peeling of the skin. Or, you might notice a red rash with swelling of the face, lips or lymph nodes in your neck or under your arms. This medicine may affect your concentration, function, or may hide signs that you are tired. You may get dizzy. Do not drive, use machinery, or do anything that needs mental alertness until you know how  this drug affects you. Alcohol can make you more dizzy and may interfere with your response to this medicine or your alertness. Avoid alcoholic drinks. Birth control pills may not work properly while you are taking this medicine. Talk to your doctor about  using an extra method of birth control. It is unknown if the effects of this medicine will be increased by the use of caffeine. Caffeine is available in many foods, beverages, and medications. Ask your doctor if you should limit or change your intake of caffeine-containing products while on this medicine. What side effects may I notice from receiving this medicine? Side effects that you should report to your doctor or health care professional as soon as possible:  allergic reactions like skin rash, itching or hives, swelling of the face, lips, or tongue  anxiety  breathing problems  chest pain  fast, irregular heartbeat  hallucinations  increased blood pressure  rash, fever, and swollen lymph nodes  redness, blistering, peeling or loosening of the skin, including inside the mouth  sore throat, fever, or chills  suicidal thoughts or other mood changes  tremors  vomiting Side effects that usually do not require medical attention (report to your doctor or health care professional if they continue or are bothersome):  headache  nausea, diarrhea, or stomach upset  nervousness  trouble sleeping This list may not describe all possible side effects. Call your doctor for medical advice about side effects. You may report side effects to FDA at 1-800-FDA-1088. Where should I keep my medicine? Keep out of the reach of children. This medicine can be abused. Keep your medicine in a safe place to protect it from theft. Do not share this medicine with anyone. Selling or giving away this medicine is dangerous and against the law. This medicine may cause accidental overdose and death if taken by other adults, children, or pets. Mix any unused medicine with a substance like cat litter or coffee grounds. Then throw the medicine away in a sealed container like a sealed bag or a coffee can with a lid. Do not use the medicine after the expiration date. Store at room temperature between 20 and 25  degrees C (68 and 77 degrees F). NOTE: This sheet is a summary. It may not cover all possible information. If you have questions about this medicine, talk to your doctor, pharmacist, or health care provider.  2020 Elsevier/Gold Standard (2019-02-08 10:08:08)

## 2020-04-17 ENCOUNTER — Telehealth: Payer: Self-pay

## 2020-04-17 LAB — COMPREHENSIVE METABOLIC PANEL
ALT: 16 IU/L (ref 0–32)
AST: 14 IU/L (ref 0–40)
Albumin/Globulin Ratio: 2.1 (ref 1.2–2.2)
Albumin: 4.4 g/dL (ref 3.8–4.8)
Alkaline Phosphatase: 81 IU/L (ref 48–121)
BUN/Creatinine Ratio: 15 (ref 9–23)
BUN: 11 mg/dL (ref 6–20)
Bilirubin Total: 0.3 mg/dL (ref 0.0–1.2)
CO2: 24 mmol/L (ref 20–29)
Calcium: 9.1 mg/dL (ref 8.7–10.2)
Chloride: 103 mmol/L (ref 96–106)
Creatinine, Ser: 0.71 mg/dL (ref 0.57–1.00)
GFR calc Af Amer: 127 mL/min/{1.73_m2} (ref >59)
GFR calc non Af Amer: 110 mL/min/{1.73_m2} (ref >59)
Globulin, Total: 2.1 g/dL (ref 1.5–4.5)
Glucose: 86 mg/dL (ref 65–99)
Potassium: 4.6 mmol/L (ref 3.5–5.2)
Sodium: 139 mmol/L (ref 134–144)
Total Protein: 6.5 g/dL (ref 6.0–8.5)

## 2020-04-17 LAB — CBC WITH DIFFERENTIAL/PLATELET
Basophils Absolute: 0 10*3/uL (ref 0.0–0.2)
Basos: 1 %
EOS (ABSOLUTE): 0.1 10*3/uL (ref 0.0–0.4)
Eos: 3 %
Hematocrit: 41.3 % (ref 34.0–46.6)
Hemoglobin: 13.5 g/dL (ref 11.1–15.9)
Immature Grans (Abs): 0 10*3/uL (ref 0.0–0.1)
Immature Granulocytes: 1 %
Lymphocytes Absolute: 0.5 10*3/uL — ABNORMAL LOW (ref 0.7–3.1)
Lymphs: 13 %
MCH: 29.4 pg (ref 26.6–33.0)
MCHC: 32.7 g/dL (ref 31.5–35.7)
MCV: 90 fL (ref 79–97)
Monocytes Absolute: 0.4 10*3/uL (ref 0.1–0.9)
Monocytes: 11 %
Neutrophils Absolute: 2.8 10*3/uL (ref 1.4–7.0)
Neutrophils: 71 %
Platelets: 226 10*3/uL (ref 150–450)
RBC: 4.59 x10E6/uL (ref 3.77–5.28)
RDW: 12.4 % (ref 11.7–15.4)
WBC: 3.9 10*3/uL (ref 3.4–10.8)

## 2020-04-17 LAB — TSH: TSH: 2.5 u[IU]/mL (ref 0.450–4.500)

## 2020-04-17 NOTE — Progress Notes (Signed)
I have read the note, and I agree with the clinical assessment and plan.  Ilithyia Titzer K Quinita Kostelecky   

## 2020-04-17 NOTE — Telephone Encounter (Addendum)
Attempted to call the patient re: documentation below. Pt was not available LM on the VM for the patient to call back  ----- Message from Glean Salvo, NP sent at 04/17/2020  5:59 AM EDT ----- Labs are overall stable. TSH is normal.

## 2020-04-18 NOTE — Telephone Encounter (Signed)
Pt was not available. LM on the VM for the patient to call back. This is my 3rd attempt I will send a letter by mail

## 2020-04-24 ENCOUNTER — Telehealth: Payer: Self-pay | Admitting: Neurology

## 2020-04-24 NOTE — Telephone Encounter (Signed)
[employer] Auth: NPR Ref # [id number] at 3:02 Pm order sent to GI. They will reach out to the patient to schedule.

## 2020-04-26 ENCOUNTER — Encounter: Payer: Self-pay | Admitting: Neurology

## 2020-04-29 ENCOUNTER — Other Ambulatory Visit: Payer: Self-pay | Admitting: Neurology

## 2020-04-29 DIAGNOSIS — G35 Multiple sclerosis: Secondary | ICD-10-CM

## 2020-04-29 NOTE — Progress Notes (Signed)
Patient should have been referred to ophthalmology not cardiology. I am not sure what happened with this, none the less-it was ophthalmology that was discussed.

## 2020-05-06 ENCOUNTER — Other Ambulatory Visit: Payer: Self-pay

## 2020-05-06 ENCOUNTER — Ambulatory Visit
Admission: RE | Admit: 2020-05-06 | Discharge: 2020-05-06 | Disposition: A | Discharge status: Home / Self Care | Payer: BLUE CROSS/BLUE SHIELD | Source: Ambulatory Visit | Attending: Neurology | Admitting: Neurology

## 2020-05-06 DIAGNOSIS — G35 Multiple sclerosis: Secondary | ICD-10-CM | POA: Diagnosis not present

## 2020-05-06 MED ORDER — GADOBENATE DIMEGLUMINE 529 MG/ML IV SOLN
13.0000 mL | Freq: Once | INTRAVENOUS | Status: AC | PRN
  Administered 2020-05-06: 13 mL via INTRAVENOUS

## 2020-05-07 ENCOUNTER — Telehealth: Payer: Self-pay | Admitting: Neurology

## 2020-05-07 DIAGNOSIS — G35 Multiple sclerosis: Secondary | ICD-10-CM

## 2020-05-07 NOTE — Telephone Encounter (Signed)
The patient has been on Gilenya for a number of years, she had a clinical exacerbation in 2019, and she has new MS lesions in the brainstem and subcortical white matter on current evaluation as compared to 2019.  She has evidence of posterior fossa and spinal cord lesions placing her at high risk for disability, I would consider switching over to Tysabri or Ocrevus at this point.  MRI evaluation should be annual for the next several years.

## 2020-05-07 NOTE — Telephone Encounter (Signed)
Can you please take a look at these recent MRI scans.  She is currently taking Gilenya, has been on since 2012.  I saw her June 1, MS overall stable, no worsening symptoms.  Looks like new lesions are present, would you suggest considering a switch to Tysabri or Ocrevus at this point?  MRI of the brain with and without contrast 05/06/2020 IMPRESSION: This MRI of the brain with and without contrast shows the following: 1.   Multiple T2/FLAIR hyperintense foci in the hemispheres and a couple foci in the pons and right cerebellar hemisphere in a pattern and configuration consistent with chronic demyelinating plaque associated with multiple sclerosis.  Although none of the foci appear to be acute and they do not enhance, the focus in the left pons and one of the small hemispheric foci that were present on the current MRI were not present on the 2019 MRI.  This is consistent with MS activity. 2.   There is a normal enhancement pattern and no acute findings  MRI cervical spine with and without contrast 05/06/2020 IMPRESSION:  This MRI of the cervical spine with and without contrast shows the following:  1.   T2 hyperintense foci within the pons, right cerebellar hemisphere, cervicomedullary junction and adjacent to C3, C3-C4 and C5 within the spinal cord.  None of these foci enhance or appear to be acute 2.   There are no significant disc degenerative changes noted 3.   There is a normal enhancement pattern.

## 2020-05-08 NOTE — Telephone Encounter (Signed)
I called the patient, we reviewed MRIs, discussed new MS activity was noted on Gilenya, we will proceed with planning to switch to Tysabri or Ocrevus.  She will come by the office in the next few days to have screening blood work. She will do research on both medications, we will make a decision soon, may get in the office next week to discuss.

## 2020-05-08 NOTE — Addendum Note (Signed)
Addended by: Glean Salvo on: 05/08/2020 08:46 AM   Modules accepted: Orders

## 2020-05-09 ENCOUNTER — Other Ambulatory Visit: Payer: Self-pay

## 2020-05-09 ENCOUNTER — Telehealth: Payer: Self-pay | Admitting: *Deleted

## 2020-05-09 ENCOUNTER — Other Ambulatory Visit (INDEPENDENT_AMBULATORY_CARE_PROVIDER_SITE_OTHER): Payer: Self-pay

## 2020-05-09 DIAGNOSIS — G35 Multiple sclerosis: Secondary | ICD-10-CM

## 2020-05-09 DIAGNOSIS — Z0289 Encounter for other administrative examinations: Secondary | ICD-10-CM

## 2020-05-09 NOTE — Telephone Encounter (Signed)
JCV Ab collected, sent to [facility] outbox 05-09-20.

## 2020-05-12 LAB — QUANTIFERON-TB GOLD PLUS
QuantiFERON Mitogen Value: 1.06 IU/mL
QuantiFERON Nil Value: 0 IU/mL
QuantiFERON TB1 Ag Value: 0.01 IU/mL
QuantiFERON TB2 Ag Value: 0.01 IU/mL
QuantiFERON-TB Gold Plus: NEGATIVE

## 2020-05-12 LAB — HEPATITIS B CORE ANTIBODY, TOTAL: Hep B Core Total Ab: POSITIVE — AB

## 2020-05-12 LAB — HEPATITIS B SURFACE ANTIGEN: Hepatitis B Surface Ag: NEGATIVE

## 2020-05-12 LAB — VARICELLA ZOSTER ANTIBODY, IGG: Varicella zoster IgG: 1023 index (ref >165)

## 2020-05-12 LAB — HEPATITIS B SURFACE ANTIBODY,QUALITATIVE: Hep B Surface Ab, Qual: NONREACTIVE

## 2020-05-12 LAB — HEPATITIS C ANTIBODY: Hep C Virus Ab: <0.1 s/co ratio (ref 0.0–0.9)

## 2020-05-14 ENCOUNTER — Telehealth: Payer: Self-pay | Admitting: Neurology

## 2020-05-14 DIAGNOSIS — G35 Multiple sclerosis: Secondary | ICD-10-CM

## 2020-05-14 NOTE — Telephone Encounter (Signed)
I called the patient.  JCV is pending, we are planning to switch to Tysabri or Ocrevus.  Screening labs for Ocrevus return, showing hep B core was positive, but hepatitis B surface antibody was nonreactive, hepatitis B surface antigen was negative as well.  Discussed this with Dr. Epimenio Foot, will refer to GI for evaluation of positive hep B core.  Patient does not have any high risk behaviors for hep B.

## 2020-05-15 ENCOUNTER — Encounter: Payer: Self-pay | Admitting: Neurology

## 2020-05-15 ENCOUNTER — Encounter: Payer: Self-pay | Admitting: Gastroenterology

## 2020-05-16 ENCOUNTER — Telehealth: Payer: Self-pay | Admitting: *Deleted

## 2020-05-16 ENCOUNTER — Telehealth: Payer: Self-pay | Admitting: Neurology

## 2020-05-16 NOTE — Telephone Encounter (Signed)
Can you please check on the status of her JCV? We are planning to switch MS medications, possibly to Tysabri or Ocrevus.

## 2020-05-16 NOTE — Telephone Encounter (Signed)
I called the patient and gave her an update on status, we also discussed her staying on Gilenya, until a decision is made about what the switch will be.

## 2020-05-16 NOTE — Telephone Encounter (Signed)
Called [facility] at [phone] to check on status of result. Spoke with Rosey Bath.  JCV ab collected on 05/09/20 positive, index: 0.89. They will fax report to [place] at [phone].

## 2020-05-16 NOTE — Telephone Encounter (Signed)
Shelby [name] Please advise me on this if you want me to try some where else Thanks Shelby [name]

## 2020-05-16 NOTE — Telephone Encounter (Signed)
Labs collected on 05/09/20:  JCV ab 0.89 (H) positive

## 2020-05-16 NOTE — Telephone Encounter (Signed)
Dr. Anne Hahn- please see JCV 0.89 positive. Patient with new MS activity by MRI on Gilenya, planning switch to Tysabri or Ocrevus. Screening blood work showed, positive Hep B core, sent to GI for evaluation, first available appointment is in August. Can we proceed with Tysabri at this point?

## 2020-05-16 NOTE — Telephone Encounter (Signed)
Patient's apt in Oct. Please advise . Thanks Annabelle Harman

## 2020-05-22 ENCOUNTER — Telehealth: Payer: Self-pay | Admitting: *Deleted

## 2020-05-22 NOTE — Telephone Encounter (Signed)
Received from [facility] fax PA for gilenya.  I have completed, to have SS/NP sign next week.

## 2020-05-22 NOTE — Telephone Encounter (Signed)
Noted per Dr. Anne Hahn, will see [facility] 06-2020.  Continue on gilenya until then.

## 2020-05-22 NOTE — Telephone Encounter (Signed)
I have completed form for SS/NP to sign for PA on Gilenya for this pt.

## 2020-05-27 NOTE — Telephone Encounter (Signed)
I called the patient, let her know Dr. Clarisa Kindred recommendation to stay on Gilenya for now, keep follow-up appointment with [facility] in August, also checked [facility], 1st available is same time frame. Will need to determine if Ocrevus is an option given positive Hep B core.   Can you check on hold up for Provigil? Patient says she can't get it from pharmacy, may require PA?

## 2020-05-28 NOTE — Telephone Encounter (Signed)
[facility] at [phone] to see if PA is needed for modafinil. They are currently closed.

## 2020-05-28 NOTE — Telephone Encounter (Addendum)
[facility] and spoke with [place]. The confirmed PA is needed for Modafinil. They sent fax request yesterday. Advised we will work on this for pt. Tried submitting PA on CMM. Key: [id number]. Unable to locate pt. Called insurance at [phone]. Was on hold but call ended on their end. I called back at pre-auth number at [phone]. I was unable to locate PA department for medication.  Called [phone] again. Asked to speak with rep via automated system. States hold time 21 min. Will try again later

## 2020-05-28 NOTE — Telephone Encounter (Signed)
Singed form faxed back to [facility].  [phone], [phone] ofv.

## 2020-05-28 NOTE — Telephone Encounter (Addendum)
[id number] for modafinil 100mg  po daily.  Diag MS and fatigue.  Initiated awaiting determination. [facility] fax confirmation received.

## 2020-06-10 NOTE — Telephone Encounter (Signed)
Called [facility] today spoke to [facility], who stated approval given 05-30-20 thru 05-30-2021.  She will fax to me approval letter to [phone].

## 2020-07-01 ENCOUNTER — Encounter: Payer: Self-pay | Admitting: Neurology

## 2020-07-02 NOTE — Telephone Encounter (Signed)
Let me know and will get back to her.

## 2020-07-10 ENCOUNTER — Other Ambulatory Visit: Payer: BLUE CROSS/BLUE SHIELD

## 2020-07-10 ENCOUNTER — Encounter: Payer: Self-pay | Admitting: Gastroenterology

## 2020-07-10 ENCOUNTER — Ambulatory Visit: Payer: BLUE CROSS/BLUE SHIELD | Admitting: Gastroenterology

## 2020-07-10 VITALS — BP 110/72 | HR 78 | Ht 63.5 in | Wt 149.0 lb

## 2020-07-10 DIAGNOSIS — R768 Other specified abnormal immunological findings in serum: Secondary | ICD-10-CM | POA: Diagnosis not present

## 2020-07-10 NOTE — Progress Notes (Signed)
HPI: This is a very pleasant 38 year old woman who was referred to me by Glean Salvo, NP  to evaluate abnormal hepatitis B labs.    She was diagnosed with multiple sclerosis about 10 years ago.  She is currently on a oral medication for this.  It sounds like her disease has progressed by imaging and she is being considered for stronger, intravenous type medicine for her MS.  As part of this work-up she underwent standard testing including TB testing and hepatitis B testing.  See those results summarized below.  She has never had viral hepatitis or any other type of hepatitis that she is aware of.  She has never become jaundiced.  She has never used IV drugs.  She has only had 2 sexual partners.  Old Data Reviewed: Blood work June 2021 complete metabolic profile was normal, her liver tests were all completely normal.  Hepatitis B surface antibody nonreactive, hepatitis C antibody nonreactive.  Hepatitis B core antibody total positive.  Hepatitis B surface antigen negative   Review of systems: Pertinent positive and negative review of systems were noted in the above HPI section. All other review negative.   Past Medical History:  Diagnosis Date  . Anxiety   . Depression   . Hyperlipidemia   . Multiple sclerosis (HCC) 2012  . Obesity     Past Surgical History:  Procedure Laterality Date  . BREAST BIOPSY Left   . INTRAUTERINE DEVICE (IUD) INSERTION  07/2016  . WISDOM TOOTH EXTRACTION N/A 2003    Current Outpatient Medications  Medication Sig Dispense Refill  . Fingolimod HCl (GILENYA) 0.5 MG CAPS TAKE 1 CAPSULE BY MOUTH EVERY DAY MAY TAKE WITH OR WITHOUT FOOD. STORE AT ROOM TEMPERATURE 30 capsule 11  . modafinil (PROVIGIL) 100 MG tablet Take 1 tablet (100 mg total) by mouth in the morning. (Patient taking differently: Take 100 mg by mouth in the morning. As needed) 30 tablet 3  . sertraline (ZOLOFT) 100 MG tablet Take 1 tablet (100 mg total) by mouth daily. 90 tablet 3   No  current facility-administered medications for this visit.    Allergies as of 07/10/2020 - Review Complete 07/10/2020  Allergen Reaction Noted  . Bactrim [sulfamethoxazole-trimethoprim] Anaphylaxis 09/26/2014    Family History  Problem Relation Age of Onset  . Bipolar disorder Mother   . Schizophrenia Mother   . Early death Mother   . Heart attack Mother   . Throat cancer Father   . Early death Father   . Esophageal cancer Father   . Multiple sclerosis Maternal Grandmother   . Cancer Maternal Grandmother   . Breast cancer Maternal Grandmother   . Breast cancer Paternal Grandmother   . Multiple sclerosis Paternal Grandfather   . Stomach cancer Paternal Grandfather   . Multiple sclerosis Maternal Aunt   . Colon cancer Neg Hx   . Pancreatic cancer Neg Hx     Social History   Socioeconomic History  . Marital status: Married    Spouse name: Not on file  . Number of children: 0  . Years of education: HS  . Highest education level: Not on file  Occupational History  . Occupation: Chief Financial Officer  Tobacco Use  . Smoking status: Never Smoker  . Smokeless tobacco: Never Used  Vaping Use  . Vaping Use: Never used  Substance and Sexual Activity  . Alcohol use: Yes    Alcohol/week: 0.0 standard drinks    Comment: rare  . Drug use: Yes  Types: Marijuana    Comment: daily  . Sexual activity: Yes    Birth control/protection: Pill  Other Topics Concern  . Not on file  Social History Narrative   Patient is left handed.   Patient lives at home with significant other   Patient drinks coffee, tea, soda daily    Social Determinants of Health   Financial Resource Strain:   . Difficulty of Paying Living Expenses: Not on file  Food Insecurity:   . Worried About Programme researcher, broadcasting/film/video in the Last Year: Not on file  . Ran Out of Food in the Last Year: Not on file  Transportation Needs:   . Lack of Transportation (Medical): Not on file  . Lack of Transportation (Non-Medical): Not on  file  Physical Activity:   . Days of Exercise per Week: Not on file  . Minutes of Exercise per Session: Not on file  Stress:   . Feeling of Stress : Not on file  Social Connections:   . Frequency of Communication with Friends and Family: Not on file  . Frequency of Social Gatherings with Friends and Family: Not on file  . Attends Religious Services: Not on file  . Active Member of Clubs or Organizations: Not on file  . Attends Banker Meetings: Not on file  . Marital Status: Not on file  Intimate Partner Violence:   . Fear of Current or Ex-Partner: Not on file  . Emotionally Abused: Not on file  . Physically Abused: Not on file  . Sexually Abused: Not on file     Physical Exam: BP 110/72 (BP Location: Left Arm, Patient Position: Sitting, Cuff Size: Normal)   Pulse 78   Ht 5' 3.5" (1.613 m)   Wt 149 lb (67.6 kg)   SpO2 98%   BMI 25.98 kg/m  Constitutional: generally well-appearing Psychiatric: alert and oriented x3 Eyes: extraocular movements intact Mouth: oral pharynx moist, no lesions Neck: supple no lymphadenopathy Cardiovascular: heart regular rate and rhythm Lungs: clear to auscultation bilaterally Abdomen: soft, nontender, nondistended, no obvious ascites, no peritoneal signs, normal bowel sounds Extremities: no lower extremity edema bilaterally Skin: no lesions on visible extremities   Assessment and plan: 39 y.o. female with abnormal hepatitis B laboratory testing  Her hepatitis B core antibody total was positive.  This could represent IgM or IgG.  Her surface antibody and surface antigen were both negative.  Her transaminases are all completely normal.  She has no risk factors for hepatitis B.  I think most likely this lab test represents a false positive.  Perhaps it represents prior infection which has since resolved or perhaps it represents immunity from a vaccination.  She is not sure if she had vaccination.  I think any of these explanations are  more likely than her currently having hepatitis B.  Certainly to be safe recommending further blood testing which will include hepatitis B core IgM, hepatitis B core IgG and hepatitis B viral DNA qualitative level to check for circulating hepatitis B virus.   Please see the "Patient Instructions" section for addition details about the plan.   Rob Bunting, MD [facility] 07/10/2020, 10:27 AM  Cc: Glean Salvo, NP  Total time on date of encounter was 45  minutes (this included time spent preparing to see the patient reviewing records; obtaining and/or reviewing separately obtained history; performing a medically appropriate exam and/or evaluation; counseling and educating the patient and family if present; ordering medications, tests or procedures if applicable;  and documenting clinical information in the health record).

## 2020-07-10 NOTE — Patient Instructions (Signed)
If you are age 37 or older, your body mass index should be between 23-30. Your Body mass index is 25.98 kg/m. If this is out of the aforementioned range listed, please consider follow up with your Primary Care Provider.  If you are age 76 or younger, your body mass index should be between 19-25. Your Body mass index is 25.98 kg/m. If this is out of the aformentioned range listed, please consider follow up with your Primary Care Provider.   Your provider has requested that you go to the basement level for lab work before leaving today. Press "B" on the elevator. The lab is located at the first door on the left as you exit the elevator.  Due to recent changes in healthcare laws, you may see the results of your imaging and laboratory studies on MyChart before your provider has had a chance to review them.  We understand that in some cases there may be results that are confusing or concerning to you. Not all laboratory results come back in the same time frame and the provider may be waiting for multiple results in order to interpret others.  Please give [place] 48 hours in order for your provider to thoroughly review all the results before contacting the office for clarification of your results.   Thank you for entrusting me with your care and choosing [facility].  Dr Christella Hartigan

## 2020-07-12 LAB — HEPATITIS B DNA, ULTRAQUANTITATIVE, PCR
Hepatitis B DNA (Calc): <1 Log IU/mL
Hepatitis B DNA: <10 IU/mL

## 2020-07-12 LAB — HEPATITIS B CORE ANTIBODY, IGM: Hep B C IgM: NONREACTIVE

## 2020-07-12 LAB — HEPATITIS B CORE ANTIBODY, TOTAL: Hep B Core Total Ab: NONREACTIVE

## 2020-07-15 ENCOUNTER — Telehealth: Payer: Self-pay | Admitting: Neurology

## 2020-07-15 ENCOUNTER — Telehealth: Payer: Self-pay | Admitting: Gastroenterology

## 2020-07-15 NOTE — Telephone Encounter (Signed)
She had GI evaluation, hepatitis B testing was completely negative, the previous result was felt to be false positive.  Routine screening MRI of the brain back in June 2021 showed new lesions compared to MRI in 2019, is consistent with MS activity.  She remains on Gilenya, since we now have GI evaluation to rule out hepatitis B, would you recommend moving forward with switching to Ocrevus at this point?  Patient does have maternal and paternal grandmother history of breast cancer  Copied and pasted from MS society Ocrevus prescribing guidelines: 5.4 Malignancies An increased risk of malignancy with OCREVUS may exist. In controlled trials, malignancies, including breast cancer, occurred more frequently in OCREVUS-treated patients. Breast cancer occurred in 6 of 781 females treated with OCREVUS and none of 668 females treated with REBIF or placebo. Patients should follow standard breast cancer screening guidelines.

## 2020-07-15 NOTE — Telephone Encounter (Signed)
Pt is requesting a call back from Patty. 

## 2020-07-15 NOTE — Telephone Encounter (Signed)
See 8/30 result note

## 2020-07-16 NOTE — Telephone Encounter (Signed)
Let's move forward with switch to Ocrevus, can we start the paperwork for this. I called the patient and left a message. She needs to stay on the Gilenya for now, will give specific instructions of when to stop once Ocrevus is approved. I did call the patient yesterday, we spoke about GI results, after talking with Dr. Anne Hahn, will move forward with switch to Ocrevus.

## 2020-07-23 NOTE — Telephone Encounter (Signed)
Ocrevus Start form faxed to [employer]. Received a receipt of confirmation.  Start form, OV notes, MRI imaging, labs, demographics, ocrevus order given to [facility]. Pt will need a 6 week washout from Gilenya prior to starting ovrevus. [facility] suite will let me know when they are ready to schedule the infusion so we can discuss washout with pt.

## 2020-07-30 NOTE — Telephone Encounter (Signed)
I spoke with Freddi Starr, RN in [facility]. Pt is not completely approved for ocrevus yet but she will let me know when that is.

## 2020-08-06 NOTE — Telephone Encounter (Signed)
Liane, RN in [facility] informed me that pt is ready to schedule and she should stop Gilenya for the 6 week washout prior to first appt. I will discuss with Maralyn Sago, NP how to stop Gilenya.

## 2020-08-06 NOTE — Telephone Encounter (Signed)
I spoke with Maralyn Sago, NP. She would prefer that pt stop Gilenya today and the schedule her ocrevus infusion for 4 weeks from today instead of 6 weeks to reduce time off of a DMT.  I called pt and discussed this with her. She is agreeable to this plan and understands that she should stop her Gilenya today and schedule the ocrevus infusion 4 weeks from today. She understands that [facility] will call her to schedule.  I have informed Liane, RN in [facility].

## 2020-09-09 NOTE — Telephone Encounter (Signed)
Pt had her first ocrevus infusion on 09/05/2020.

## 2020-10-16 NOTE — Progress Notes (Signed)
PATIENT: Shelby [name] DOB: January 24, 1981  REASON FOR VISIT: MS routine follow-up HISTORY FROM: patient  HISTORY OF PRESENT ILLNESS: Today 10/17/20 Shelby [name] is a 39 year old female with history of relapsing remitting multiple sclerosis.  Has been on Gilenya since her diagnosis.  Had routine MRI of the brain in June 2021, showed new lesions in the left pons and one of the small hemispheric foci that were not present on 2019 MRI, is consistent with MS activity (evidence of posterior fossa and spinal cord lesions, placing her at high risk for disability) consider switch to Tysabri or Ocrevus.  She was sent to GI, screening blood work for [employer] showed hep B core was positive, hepatitis B testing was completely negative, was felt to be false positive.  JCV was positive 0.89.  Had her first Ocrevus infusion 09/05/2020.  After initial Ocrevus infusion, she felt terrible for 1 week, but then got better.  Feels MS is overall stable.  Has occasional pins-and-needles in her feet, usually after hot shower.  No falls.  Has urinary urgency.  Tried Provigil 100 mg in the morning, works really well.  Usually just takes during the week, makes her feel normal.  May have occasional dizzy spells if she moves her head too fast, is random.  TSH was normal in June 2021.  Works from home full-time.  Presents today for evaluation unaccompanied.  HISTORY 04/16/2020 SS: Shelby [name] is a 39 year old female with history of relapsing remitting multiple sclerosis.  She has been on Gilenya since her diagnosis.  She had exacerbation of numbness affecting the right side in April 2019, treated with prednisone Dosepak, resulting in relief of symptoms.  MRI of the brain in April 2019 did not show any change compared to prior in July 2017.  Dr. Patrecia Pace reviewed MRI, it was felt the patient may likely have a small lower spinal cord lesion based on her symptoms.  Has remained stable since last seen.  She continues to have fatigue  daily, previously has had several sleep studies, and have been normal.  She may have intermittent numbness in her feet.  No falls, but she may trip.  She has urgency with urination.  No changes to the vision, has not had ophthalmology evaluation of recent.  Reports a few episodes of dizziness.  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  She has several animals, stays busy caring for them.  Does not have regular PCP follow-up.  Presents today for evaluation unaccompanied.   REVIEW OF SYSTEMS: Out of a complete 14 system review of symptoms, the patient complains only of the following symptoms, and all other reviewed systems are negative.  n/a  ALLERGIES: Allergies  Allergen Reactions  . Bactrim [Sulfamethoxazole-Trimethoprim] Anaphylaxis    HOME MEDICATIONS: Outpatient Medications Prior to Visit  Medication Sig Dispense Refill  . Ocrelizumab (OCREVUS IV) Inject into the vein.    Marland Kitchen sertraline (ZOLOFT) 100 MG tablet Take 1 tablet (100 mg total) by mouth daily. 90 tablet 3  . modafinil (PROVIGIL) 100 MG tablet Take 1 tablet (100 mg total) by mouth in the morning. (Patient taking differently: Take 100 mg by mouth in the morning. As needed) 30 tablet 3  . Fingolimod HCl (GILENYA) 0.5 MG CAPS TAKE 1 CAPSULE BY MOUTH EVERY DAY MAY TAKE WITH OR WITHOUT FOOD. STORE AT ROOM TEMPERATURE 30 capsule 11   No facility-administered medications prior to visit.    PAST MEDICAL HISTORY: Past Medical History:  Diagnosis Date  . Anxiety   .  Depression   . Hyperlipidemia   . Multiple sclerosis (HCC) 2012  . Obesity     PAST SURGICAL HISTORY: Past Surgical History:  Procedure Laterality Date  . BREAST BIOPSY Left   . INTRAUTERINE DEVICE (IUD) INSERTION  07/2016  . WISDOM TOOTH EXTRACTION N/A 2003    FAMILY HISTORY: Family History  Problem Relation Age of Onset  . Bipolar disorder Mother   . Schizophrenia Mother   . Early death Mother   . Heart attack Mother   . Throat cancer Father     . Early death Father   . Esophageal cancer Father   . Multiple sclerosis Maternal Grandmother   . Cancer Maternal Grandmother   . Breast cancer Maternal Grandmother   . Breast cancer Paternal Grandmother   . Multiple sclerosis Paternal Grandfather   . Stomach cancer Paternal Grandfather   . Multiple sclerosis Maternal Aunt   . Colon cancer Neg Hx   . Pancreatic cancer Neg Hx     SOCIAL HISTORY: Social History   Socioeconomic History  . Marital status: Married    Spouse name: Not on file  . Number of children: 0  . Years of education: HS  . Highest education level: Not on file  Occupational History  . Occupation: Chief Financial Officer  Tobacco Use  . Smoking status: Never Smoker  . Smokeless tobacco: Never Used  Vaping Use  . Vaping Use: Never used  Substance and Sexual Activity  . Alcohol use: Yes    Alcohol/week: 0.0 standard drinks    Comment: rare  . Drug use: Yes    Types: Marijuana    Comment: daily  . Sexual activity: Yes    Birth control/protection: Pill  Other Topics Concern  . Not on file  Social History Narrative   Patient is left handed.   Patient lives at home with significant other   Patient drinks coffee, tea, soda daily    Social Determinants of Health   Financial Resource Strain:   . Difficulty of Paying Living Expenses: Not on file  Food Insecurity:   . Worried About Programme researcher, broadcasting/film/video in the Last Year: Not on file  . Ran Out of Food in the Last Year: Not on file  Transportation Needs:   . Lack of Transportation (Medical): Not on file  . Lack of Transportation (Non-Medical): Not on file  Physical Activity:   . Days of Exercise per Week: Not on file  . Minutes of Exercise per Session: Not on file  Stress:   . Feeling of Stress : Not on file  Social Connections:   . Frequency of Communication with Friends and Family: Not on file  . Frequency of Social Gatherings with Friends and Family: Not on file  . Attends Religious Services: Not on file  .  Active Member of Clubs or Organizations: Not on file  . Attends Banker Meetings: Not on file  . Marital Status: Not on file  Intimate Partner Violence:   . Fear of Current or Ex-Partner: Not on file  . Emotionally Abused: Not on file  . Physically Abused: Not on file  . Sexually Abused: Not on file   PHYSICAL EXAM  Vitals:   10/17/20 0908  BP: (!) 105/51  Pulse: 71  Weight: 148 lb 6.4 oz (67.3 kg)  Height: 5' 3.5" (1.613 m)   Body mass index is 25.88 kg/m.  Generalized: Well developed, in no acute distress   Neurological examination  Mentation: Alert oriented to time, place,  history taking. Follows all commands speech and language fluent Cranial nerve II-XII: Pupils were equal round reactive to light. Extraocular movements were full, visual field were full on confrontational test. Facial sensation and strength were normal. Head turning and shoulder shrug  were normal and symmetric. Motor: The motor testing reveals 5 over 5 strength of all 4 extremities. Good symmetric motor tone is noted throughout.  Sensory: Sensory testing is intact to soft touch on all 4 extremities. No evidence of extinction is noted.  Coordination: Cerebellar testing reveals good finger-nose-finger and heel-to-shin bilaterally.  Gait and station: Gait is normal. Tandem gait is normal. Romberg is negative. No drift is seen.  Reflexes: Deep tendon reflexes are symmetric and normal bilaterally.   DIAGNOSTIC DATA (LABS, IMAGING, TESTING) - I reviewed patient records, labs, notes, testing and imaging myself where available.  Lab Results  Component Value Date   WBC 3.9 04/16/2020   HGB 13.5 04/16/2020   HCT 41.3 04/16/2020   MCV 90 04/16/2020   PLT 226 04/16/2020      Component Value Date/Time   NA 139 04/16/2020 0937   K 4.6 04/16/2020 0937   CL 103 04/16/2020 0937   CO2 24 04/16/2020 0937   GLUCOSE 86 04/16/2020 0937   BUN 11 04/16/2020 0937   CREATININE 0.71 04/16/2020 0937    CALCIUM 9.1 04/16/2020 0937   PROT 6.5 04/16/2020 0937   ALBUMIN 4.4 04/16/2020 0937   AST 14 04/16/2020 0937   ALT 16 04/16/2020 0937   ALKPHOS 81 04/16/2020 0937   BILITOT 0.3 04/16/2020 0937   GFRNONAA 110 04/16/2020 0937   GFRAA 127 04/16/2020 0937   Lab Results  Component Value Date   CHOL 254 (H) 07/15/2016   HDL 57 07/15/2016   LDLCALC 172 (H) 07/15/2016   TRIG 126 07/15/2016   CHOLHDL 4.5 07/15/2016   No results found for: HGBA1C No results found for: VITAMINB12 Lab Results  Component Value Date   TSH 2.500 04/16/2020    ASSESSMENT AND PLAN 39 y.o. year old female  has a past medical history of Anxiety, Depression, Hyperlipidemia, Multiple sclerosis (HCC) (2012), and Obesity. here with:  1.  Relapsing remitting multiple sclerosis -Remains overall stable, fortunately remains highly functional  -First Ocrevus in October 2021, will continue this -Check routine blood work today  -Last MRI of the brain in June 2021, showed new MS activity, will repeat MRIs on annual basis  2.  Fatigue -Continue Provigil 100 mg daily (refilled today, drug registry checked last fill 08/15/2020 # 30) -Check vitamin D level, TSH was normal   3.  Depression -Continue Zoloft 100 mg daily  She will follow-up in 5 months with Dr. Anne Hahn (around May, will need MRI in June), I have also referred her to primary care provider.  She will call for any new or worsening MS symptoms.  Orders Placed This Encounter  Procedures  . CBC with Differential/Platelet  . CMP  . IgG, IgA, IgM  . Vitamin D, 25-hydroxy  . Ambulatory referral to Internal Medicine   I spent 30 minutes of face-to-face and non-face-to-face time with patient.  This included previsit chart review, lab review, study review, order entry, electronic health record documentation, patient education.  Margie Ege, AGNP-C, DNP 10/17/2020, 9:40 AM [facility] [address] [place], [place] [address] [phone]

## 2020-10-17 ENCOUNTER — Encounter: Payer: Self-pay | Admitting: Neurology

## 2020-10-17 ENCOUNTER — Ambulatory Visit: Payer: BLUE CROSS/BLUE SHIELD | Admitting: Neurology

## 2020-10-17 ENCOUNTER — Other Ambulatory Visit: Payer: Self-pay

## 2020-10-17 VITALS — BP 105/51 | HR 71 | Ht 63.5 in | Wt 148.4 lb

## 2020-10-17 DIAGNOSIS — G35 Multiple sclerosis: Secondary | ICD-10-CM | POA: Diagnosis not present

## 2020-10-17 DIAGNOSIS — R5383 Other fatigue: Secondary | ICD-10-CM

## 2020-10-17 DIAGNOSIS — F418 Other specified anxiety disorders: Secondary | ICD-10-CM | POA: Diagnosis not present

## 2020-10-17 MED ORDER — MODAFINIL 100 MG PO TABS: 100.0000 mg | ORAL_TABLET | Freq: Every morning | ORAL | 5 refills | Status: DC

## 2020-10-17 NOTE — Progress Notes (Signed)
I have read the note, and I agree with the clinical assessment and plan.  Messi Twedt K Easten Maceachern   

## 2020-10-17 NOTE — Patient Instructions (Signed)
Check blood work today Continue Ocrevus Continue current medications See you back in 5 months Referred you to primary care doctor

## 2020-10-18 LAB — COMPREHENSIVE METABOLIC PANEL
ALT: 9 IU/L (ref 0–32)
AST: 10 IU/L (ref 0–40)
Albumin/Globulin Ratio: 2.4 — ABNORMAL HIGH (ref 1.2–2.2)
Albumin: 4.8 g/dL (ref 3.8–4.8)
Alkaline Phosphatase: 85 IU/L (ref 44–121)
BUN/Creatinine Ratio: 19 (ref 9–23)
BUN: 13 mg/dL (ref 6–20)
Bilirubin Total: 0.3 mg/dL (ref 0.0–1.2)
CO2: 24 mmol/L (ref 20–29)
Calcium: 9.7 mg/dL (ref 8.7–10.2)
Chloride: 102 mmol/L (ref 96–106)
Creatinine, Ser: 0.7 mg/dL (ref 0.57–1.00)
GFR calc Af Amer: 128 mL/min/{1.73_m2} (ref >59)
GFR calc non Af Amer: 111 mL/min/{1.73_m2} (ref >59)
Globulin, Total: 2 g/dL (ref 1.5–4.5)
Glucose: 81 mg/dL (ref 65–99)
Potassium: 5.6 mmol/L — ABNORMAL HIGH (ref 3.5–5.2)
Sodium: 140 mmol/L (ref 134–144)
Total Protein: 6.8 g/dL (ref 6.0–8.5)

## 2020-10-18 LAB — CBC WITH DIFFERENTIAL/PLATELET
Basophils Absolute: 0.1 10*3/uL (ref 0.0–0.2)
Basos: 2 %
EOS (ABSOLUTE): 0.1 10*3/uL (ref 0.0–0.4)
Eos: 2 %
Hematocrit: 43.3 % (ref 34.0–46.6)
Hemoglobin: 14.4 g/dL (ref 11.1–15.9)
Immature Grans (Abs): 0 10*3/uL (ref 0.0–0.1)
Immature Granulocytes: 1 %
Lymphocytes Absolute: 1 10*3/uL (ref 0.7–3.1)
Lymphs: 18 %
MCH: 29.8 pg (ref 26.6–33.0)
MCHC: 33.3 g/dL (ref 31.5–35.7)
MCV: 90 fL (ref 79–97)
Monocytes Absolute: 0.5 10*3/uL (ref 0.1–0.9)
Monocytes: 10 %
Neutrophils Absolute: 3.7 10*3/uL (ref 1.4–7.0)
Neutrophils: 67 %
Platelets: 280 10*3/uL (ref 150–450)
RBC: 4.83 x10E6/uL (ref 3.77–5.28)
RDW: 12.2 % (ref 11.7–15.4)
WBC: 5.5 10*3/uL (ref 3.4–10.8)

## 2020-10-18 LAB — IGG, IGA, IGM
IgA/Immunoglobulin A, Serum: 130 mg/dL (ref 87–352)
IgG (Immunoglobin G), Serum: 747 mg/dL (ref 586–1602)
IgM (Immunoglobulin M), Srm: 80 mg/dL (ref 26–217)

## 2020-10-18 LAB — VITAMIN D 25 HYDROXY (VIT D DEFICIENCY, FRACTURES): Vit D, 25-Hydroxy: 9.9 ng/mL — ABNORMAL LOW (ref 30.0–100.0)

## 2020-10-21 ENCOUNTER — Telehealth: Payer: Self-pay | Admitting: Neurology

## 2020-10-21 DIAGNOSIS — G35 Multiple sclerosis: Secondary | ICD-10-CM

## 2020-10-21 MED ORDER — VITAMIN D (ERGOCALCIFEROL) 1.25 MG (50000 UNIT) PO CAPS: 50000.0000 [IU] | ORAL_CAPSULE | ORAL | 0 refills | Status: DC

## 2020-10-21 NOTE — Telephone Encounter (Signed)
Labs show Vitamin D level is very low at 9.9. Would like to start on Vitamin D 50,000 units 1 capsule every 7 days for 12 weeks then recheck. Potassium level also came back elevated 5.6, no mention of hemolysis, we should recheck this week if possible. Continue Ocrevus every 6 month intervals. Hopefully will be established with PCP in a few weeks, can manage Vitamin D going forward. Sent script for Vitamin D to pharmacy. Sent reminder to recheck Vitamin D in late Feb.

## 2020-10-21 NOTE — Telephone Encounter (Signed)
Pt returned call. Please call back when available. 

## 2020-10-21 NOTE — Telephone Encounter (Signed)
I sent mychart message after playing phone tag x 2.  LMVM fir pt as well.  She is to call back if questions.

## 2020-10-21 NOTE — Telephone Encounter (Signed)
LMVM for pt to return call for lab results.  

## 2020-10-24 ENCOUNTER — Other Ambulatory Visit (INDEPENDENT_AMBULATORY_CARE_PROVIDER_SITE_OTHER): Payer: Self-pay

## 2020-10-24 ENCOUNTER — Other Ambulatory Visit: Payer: Self-pay

## 2020-10-24 DIAGNOSIS — G35 Multiple sclerosis: Secondary | ICD-10-CM

## 2020-10-24 DIAGNOSIS — Z0289 Encounter for other administrative examinations: Secondary | ICD-10-CM

## 2020-10-25 LAB — POTASSIUM: Potassium: 4.6 mmol/L (ref 3.5–5.2)

## 2021-01-06 ENCOUNTER — Telehealth: Payer: Self-pay | Admitting: Neurology

## 2021-01-06 DIAGNOSIS — G35 Multiple sclerosis: Secondary | ICD-10-CM

## 2021-01-06 NOTE — Telephone Encounter (Signed)
Can we recheck her Vitamin D level, make sure almost done with her 50,000 unit weekly dosing,  Was going to be on for 12 weeks. Started early Dec. Order is in.

## 2021-01-07 NOTE — Telephone Encounter (Signed)
Called patient.  Relayed message by [facility]  that I did leave message on her my chart account concerning the vitamin D level SS/NP wanted to get after she finished her once a week dosing for 12 weeks vitamin D prescription.  Patient to return call if questions arise.

## 2021-01-08 ENCOUNTER — Other Ambulatory Visit: Payer: Self-pay | Admitting: Neurology

## 2021-01-15 ENCOUNTER — Other Ambulatory Visit (INDEPENDENT_AMBULATORY_CARE_PROVIDER_SITE_OTHER): Payer: Self-pay

## 2021-01-15 DIAGNOSIS — G35 Multiple sclerosis: Secondary | ICD-10-CM

## 2021-01-15 DIAGNOSIS — Z0289 Encounter for other administrative examinations: Secondary | ICD-10-CM

## 2021-01-16 ENCOUNTER — Telehealth: Payer: Self-pay

## 2021-01-16 LAB — VITAMIN D 25 HYDROXY (VIT D DEFICIENCY, FRACTURES): Vit D, 25-Hydroxy: 40.3 ng/mL (ref 30.0–100.0)

## 2021-01-16 NOTE — Telephone Encounter (Signed)
-----   Message from Glean Salvo, NP sent at 01/16/2021  7:59 AM EST ----- Sent my chart message: [name], Vitamin D is much improved 40.3, which is now within normal range. Once finished with prescription dosing, start taking [phone] units daily. You really need a primary care doctor who can follow this overtime, as you may require prescription dosing long term. Do you have someone you can see? Maralyn Sago

## 2021-02-19 ENCOUNTER — Telehealth: Payer: Self-pay | Admitting: *Deleted

## 2021-02-19 NOTE — Telephone Encounter (Signed)
Received fax for [facility] re: to needing diagnosis codes relating to Vit D test.  Gave R53.83, F41.8, E55.9 (vit d def back in 10-2020).  Fax confirmation received [phone].

## 2021-03-21 ENCOUNTER — Other Ambulatory Visit: Payer: Self-pay

## 2021-03-21 ENCOUNTER — Ambulatory Visit: Payer: BLUE CROSS/BLUE SHIELD | Admitting: Family Medicine

## 2021-03-21 ENCOUNTER — Encounter: Payer: Self-pay | Admitting: Family Medicine

## 2021-03-21 VITALS — BP 120/80 | HR 93 | Ht 64.0 in | Wt 142.6 lb

## 2021-03-21 DIAGNOSIS — Z975 Presence of (intrauterine) contraceptive device: Secondary | ICD-10-CM

## 2021-03-21 DIAGNOSIS — E7800 Pure hypercholesterolemia, unspecified: Secondary | ICD-10-CM

## 2021-03-21 DIAGNOSIS — R5383 Other fatigue: Secondary | ICD-10-CM

## 2021-03-21 DIAGNOSIS — R400 Somnolence: Secondary | ICD-10-CM

## 2021-03-21 DIAGNOSIS — F418 Other specified anxiety disorders: Secondary | ICD-10-CM

## 2021-03-21 DIAGNOSIS — E559 Vitamin D deficiency, unspecified: Secondary | ICD-10-CM

## 2021-03-21 DIAGNOSIS — G35 Multiple sclerosis: Secondary | ICD-10-CM

## 2021-03-21 NOTE — Patient Instructions (Signed)
You can call to schedule your appointment with the counselor. A few offices are listed below for you to call.    [facility] [address] [place], [place] [address]  Phone: [phone]  [facility]  [address], [place], [place] [address]  Phone: [phone]  [facility] [address] [place], [place] [address] [phone] for appointments   [facility]  [address] [place], [place] [address] [phone]  [facility]  [address]  (across from [facility])  [phone]   [facility] only [address], [place], [place] [address] [phone]  [facility] [address] , [place] [address] [phone]       

## 2021-03-21 NOTE — Progress Notes (Signed)
   Subjective:    Patient ID: Shelby [name], female    DOB: [birth year], 39 y.o.   MRN: [id number]  HPI Chief Complaint  Patient presents with  . new pt get established    New pt get established. Due for IUD to come out in sept or October. Hasn't seen family doctor 7 years. PQ-9 abnormal but on medication   To the practice here to establish care. Previous medical care: No PCP in 7 years or more.   Other providers: Neurologist- Margie Ege at [facility]   On Provigil for fatigue related to MS   MS- IV infusions every 6 months   Anxiety and depression - taking sertraline and has been on it since 2012-2103  Does not have a therapist.  Mother had mental illness and left when she was young   Suicidal ideation with no attempt and states she would not actually follow through with harming herself.    States she has lost 100 lbs over the past few years doing intermittent fasting.   She has a history of HL and is fasting today.   Vitamin D def in the past. Is currently taking OTC 2,000 IUs of D3 Daily.   Sister with HTN, HL and obesity   Depression screen [facility] 2/9 03/21/2021 03/21/2021 08/05/2016 07/15/2016 07/15/2016  Decreased Interest 0 0 0 0 0  Down, Depressed, Hopeless - 0 0 0 0  PHQ - 2 Score 0 0 0 0 0  Altered sleeping 0 - - - -  Tired, decreased energy 3 - - - -  Change in appetite 0 - - - -  Feeling bad or failure about yourself  0 - - - -  Trouble concentrating 3 - - - -  Moving slowly or fidgety/restless 1 - - - -  Suicidal thoughts 1 - - - -  PHQ-9 Score 8 - - - -  Difficult doing work/chores Not difficult at all - - - -     Marketing operations    Review of Systems Pertinent positives and negatives in the history of present illness.     Objective:   Physical Exam BP 120/80   Pulse 93   Ht 5\' 4"  (1.626 m)   Wt 142 lb 9.6 oz (64.7 kg)   BMI 24.48 kg/m   Alert and in no distress. Cardiac exam shows a regular sinus rhythm without murmurs or gallops.  Lungs are clear to auscultation.      Assessment & Plan:  Pure hypercholesterolemia - Plan: Lipid panel -Not had a cholesterol checked since losing a significant amount of weight.  We will check her fasting lipids today in follow-up.  Recommend low-fat, low-cholesterol diet and getting adequate physical activity.  Fatigue, unspecified type -She is on Provigil for fatigue related to MS.  This is managed by neurology.  Multiple sclerosis (HCC) -Currently on IV treatments every 6 months.  Followed closely by neurology  Depression with anxiety -I will provide her with a list of counselors and she is aware that she can call and schedule a visit.  Continue sertraline  Somnolence, daytime -Managed by neurology  IUD (intrauterine device) in place - Plan: Ambulatory referral to Gynecology -Reports being due for IUD removal and would like to have a new one  Vitamin D deficiency - Plan: VITAMIN D 25 Hydroxy (Vit-D Deficiency, Fractures) -Continue on 2000 IUs of vitamin D3 and recheck vitamin D level to see if this is an adequate dose.

## 2021-03-22 LAB — LIPID PANEL
Chol/HDL Ratio: 4.1 ratio (ref 0.0–4.4)
Cholesterol, Total: 247 mg/dL — ABNORMAL HIGH (ref 100–199)
HDL: 60 mg/dL (ref >39)
LDL Chol Calc (NIH): 174 mg/dL — ABNORMAL HIGH (ref 0–99)
Triglycerides: 79 mg/dL (ref 0–149)
VLDL Cholesterol Cal: 13 mg/dL (ref 5–40)

## 2021-03-22 LAB — VITAMIN D 25 HYDROXY (VIT D DEFICIENCY, FRACTURES): Vit D, 25-Hydroxy: 25.8 ng/mL — ABNORMAL LOW (ref 30.0–100.0)

## 2021-03-26 ENCOUNTER — Other Ambulatory Visit: Payer: Self-pay

## 2021-03-26 ENCOUNTER — Ambulatory Visit: Payer: BLUE CROSS/BLUE SHIELD | Admitting: Neurology

## 2021-03-26 ENCOUNTER — Encounter: Payer: Self-pay | Admitting: Neurology

## 2021-03-26 VITALS — BP 114/76 | HR 83 | Ht 64.0 in | Wt 145.2 lb

## 2021-03-26 DIAGNOSIS — Z5181 Encounter for therapeutic drug level monitoring: Secondary | ICD-10-CM

## 2021-03-26 DIAGNOSIS — G35 Multiple sclerosis: Secondary | ICD-10-CM | POA: Diagnosis not present

## 2021-03-26 DIAGNOSIS — G35D Multiple sclerosis, unspecified: Secondary | ICD-10-CM

## 2021-03-26 NOTE — Progress Notes (Signed)
Reason for visit: Multiple sclerosis  Shelby [name] is an 40 y.o. female  History of present illness:  Shelby [name] is a 40 year old left-handed white female with a history of multiple sclerosis.  The patient had been on Gilenya but had an exacerbation with new lesions by MRI, she was switched to Ocrevus, she has tolerated her first dose, she will be getting another dose in the near future.  The patient reports no new symptoms since being on Ocrevus.  She indicates that her balance is doing well, she has had at least a 6 or 25-month history of some urinary urgency and frequency.  She has chronic fatigue but gets good relief with low-dose Provigil.  The patient works from home, she works on Production designer, theatre/television/film in [employer].  She returns to the office today for an evaluation.  There have been no new events of numbness or weakness.  Past Medical History:  Diagnosis Date  . Anxiety   . Depression   . Hyperlipidemia   . Multiple sclerosis (HCC) 2012  . Obesity     Past Surgical History:  Procedure Laterality Date  . BREAST BIOPSY Left   . INTRAUTERINE DEVICE (IUD) INSERTION  07/2016  . WISDOM TOOTH EXTRACTION N/A 2003    Family History  Problem Relation Age of Onset  . Bipolar disorder Mother   . Schizophrenia Mother   . Early death Mother   . Heart attack Mother   . Throat cancer Father   . Early death Father   . Esophageal cancer Father   . Multiple sclerosis Maternal Grandmother   . Cancer Maternal Grandmother   . Breast cancer Maternal Grandmother   . Breast cancer Paternal Grandmother   . Multiple sclerosis Paternal Grandfather   . Stomach cancer Paternal Grandfather   . Multiple sclerosis Maternal Aunt   . Colon cancer Neg Hx   . Pancreatic cancer Neg Hx     Social history:  reports that she has never smoked. She has never used smokeless tobacco. She reports current alcohol use. She reports current drug use. Drug: Marijuana.    Allergies  Allergen Reactions  .  Bactrim [Sulfamethoxazole-Trimethoprim] Anaphylaxis    Medications:  Prior to Admission medications   Medication Sig Start Date End Date Taking? Authorizing Provider  modafinil (PROVIGIL) 100 MG tablet Take 1 tablet (100 mg total) by mouth in the morning. 10/17/20  Yes Glean Salvo, NP  Ocrelizumab (OCREVUS IV) Inject into the vein.   Yes [provider]  sertraline (ZOLOFT) 100 MG tablet Take 1 tablet (100 mg total) by mouth daily. 04/16/20  Yes Glean Salvo, NP  VITAMIN D PO Take by mouth.   Yes [provider]    ROS:  Out of a complete 14 system review of symptoms, the patient complains only of the following symptoms, and all other reviewed systems are negative.  Fatigue Urinary frequency  Blood pressure 114/76, pulse 83, height 5\' 4"  (1.626 m), weight 145 lb 3.2 oz (65.9 kg).  Physical Exam  General: The patient is alert and cooperative at the time of the examination.  Skin: No significant peripheral edema is noted.   Neurologic Exam  Mental status: The patient is alert and oriented x 3 at the time of the examination. The patient has apparent normal recent and remote memory, with an apparently normal attention span and concentration ability.   Cranial nerves: Facial symmetry is present. Speech is normal, no aphasia or dysarthria is noted. Extraocular movements are  full. Visual fields are full.  Pupils are equal, round, and reactive to light.  Discs are flat bilaterally.  Motor: The patient has good strength in all 4 extremities.  Sensory examination: Soft touch sensation is symmetric on the face, arms, and legs.  Coordination: The patient has good finger-nose-finger and heel-to-shin bilaterally.  Gait and station: The patient has a normal gait. Tandem gait is normal. Romberg is negative. No drift is seen.  Reflexes: Deep tendon reflexes are symmetric.   Assessment/Plan:  1.  Multiple sclerosis  The patient does not wish to go on any  medications for her bladder currently.  The patient will have blood work done today, she will have a repeat MRI of the brain and cervical spine.  She will remain on Ocrevus for now.  She will follow up here in 6 months.  Shelby Palau MD 03/26/2021 8:54 AM  [facility] [address] [place], [place] [address]  Phone [phone] Fax [phone]

## 2021-03-27 ENCOUNTER — Telehealth: Payer: Self-pay | Admitting: Neurology

## 2021-03-27 LAB — CBC WITH DIFFERENTIAL/PLATELET
Basophils Absolute: 0.1 10*3/uL (ref 0.0–0.2)
Basos: 2 %
EOS (ABSOLUTE): 0.1 10*3/uL (ref 0.0–0.4)
Eos: 3 %
Hematocrit: 42.4 % (ref 34.0–46.6)
Hemoglobin: 13.9 g/dL (ref 11.1–15.9)
Immature Grans (Abs): 0 10*3/uL (ref 0.0–0.1)
Immature Granulocytes: 1 %
Lymphocytes Absolute: 1.1 10*3/uL (ref 0.7–3.1)
Lymphs: 27 %
MCH: 29.1 pg (ref 26.6–33.0)
MCHC: 32.8 g/dL (ref 31.5–35.7)
MCV: 89 fL (ref 79–97)
Monocytes Absolute: 0.4 10*3/uL (ref 0.1–0.9)
Monocytes: 10 %
Neutrophils Absolute: 2.4 10*3/uL (ref 1.4–7.0)
Neutrophils: 57 %
Platelets: 241 10*3/uL (ref 150–450)
RBC: 4.78 x10E6/uL (ref 3.77–5.28)
RDW: 11.9 % (ref 11.7–15.4)
WBC: 4 10*3/uL (ref 3.4–10.8)

## 2021-03-27 LAB — IGG, IGA, IGM
IgA/Immunoglobulin A, Serum: 127 mg/dL (ref 87–352)
IgG (Immunoglobin G), Serum: 799 mg/dL (ref 586–1602)
IgM (Immunoglobulin M), Srm: 70 mg/dL (ref 26–217)

## 2021-03-27 LAB — COMPREHENSIVE METABOLIC PANEL
ALT: 11 IU/L (ref 0–32)
AST: 12 IU/L (ref 0–40)
Albumin/Globulin Ratio: 2.2 (ref 1.2–2.2)
Albumin: 4.7 g/dL (ref 3.8–4.8)
Alkaline Phosphatase: 84 IU/L (ref 44–121)
BUN/Creatinine Ratio: 12 (ref 9–23)
BUN: 9 mg/dL (ref 6–20)
Bilirubin Total: 0.3 mg/dL (ref 0.0–1.2)
CO2: 23 mmol/L (ref 20–29)
Calcium: 9.4 mg/dL (ref 8.7–10.2)
Chloride: 104 mmol/L (ref 96–106)
Creatinine, Ser: 0.77 mg/dL (ref 0.57–1.00)
Globulin, Total: 2.1 g/dL (ref 1.5–4.5)
Glucose: 87 mg/dL (ref 65–99)
Potassium: 4.6 mmol/L (ref 3.5–5.2)
Sodium: 140 mmol/L (ref 134–144)
Total Protein: 6.8 g/dL (ref 6.0–8.5)
eGFR: 102 mL/min/{1.73_m2} (ref >59)

## 2021-03-27 NOTE — Telephone Encounter (Signed)
per [employer] portal no auth require order sent to GI. They will reach out to the patient to schedule.

## 2021-04-08 ENCOUNTER — Ambulatory Visit
Admission: RE | Admit: 2021-04-08 | Discharge: 2021-04-08 | Disposition: A | Discharge status: Home / Self Care | Payer: BLUE CROSS/BLUE SHIELD | Source: Ambulatory Visit | Attending: Neurology | Admitting: Neurology

## 2021-04-08 DIAGNOSIS — G35 Multiple sclerosis: Secondary | ICD-10-CM

## 2021-04-08 MED ORDER — GADOBENATE DIMEGLUMINE 529 MG/ML IV SOLN
12.0000 mL | Freq: Once | INTRAVENOUS | Status: AC | PRN
  Administered 2021-04-08: 12 mL via INTRAVENOUS

## 2021-04-09 ENCOUNTER — Telehealth: Payer: Self-pay | Admitting: Neurology

## 2021-04-09 NOTE — Telephone Encounter (Signed)
I called the patient.  MRI of the cervical spine is stable, MRI of the brain shows a possible small new lesion in the left frontal area, the patient is on Ocrevus, I would not alter therapy at this point.  We will continue to follow the scans.   MRI cervical 04/08/21:  IMPRESSION: This MRI of the cervical spine with and without contrast shows the following: 1..  Several T2 hyperintense foci within the upper and mid cervical spinal cord and also in the pons and cerebellum as detailed above.  These are consistent with chronic demyelinating plaque associated with multiple sclerosis.  None of the foci appear to be acute.  There do not appear to be any new lesions compared to the MRI from 05/06/2020. 2.   Normal enhancement pattern.   MRI brain 5/34/22:  IMPRESSION: This MRI of the brain with and without contrast shows the following: 1.   Multiple T2/FLAIR hyperintense foci in the hemispheres, left thalamus, left pons and right cerebellar hemisphere in a pattern and configuration consistent with chronic demyelinating plaque associated with multiple sclerosis.  None of the foci enhance to appear to be acute.  One small focus in the left frontal subcortical white matter seen on the current MRI is not clearly identified on the previous 2 MRIs from 2021 in 2019. 2.   Normal enhancement pattern.  No acute findings.

## 2021-04-18 ENCOUNTER — Encounter: Payer: BLUE CROSS/BLUE SHIELD | Admitting: Nurse Practitioner

## 2021-04-18 ENCOUNTER — Other Ambulatory Visit (HOSPITAL_COMMUNITY)
Admission: RE | Admit: 2021-04-18 | Discharge: 2021-04-18 | Disposition: A | Discharge status: Home / Self Care | Payer: BLUE CROSS/BLUE SHIELD | Source: Ambulatory Visit | Attending: Nurse Practitioner | Admitting: Nurse Practitioner

## 2021-04-18 ENCOUNTER — Encounter: Payer: Self-pay | Admitting: Nurse Practitioner

## 2021-04-18 ENCOUNTER — Ambulatory Visit (INDEPENDENT_AMBULATORY_CARE_PROVIDER_SITE_OTHER): Payer: BLUE CROSS/BLUE SHIELD | Admitting: Nurse Practitioner

## 2021-04-18 ENCOUNTER — Other Ambulatory Visit: Payer: Self-pay

## 2021-04-18 VITALS — BP 94/64 | HR 70 | Resp 16 | Ht 63.75 in | Wt 142.0 lb

## 2021-04-18 DIAGNOSIS — Z30431 Encounter for routine checking of intrauterine contraceptive device: Secondary | ICD-10-CM | POA: Diagnosis not present

## 2021-04-18 DIAGNOSIS — N898 Other specified noninflammatory disorders of vagina: Secondary | ICD-10-CM | POA: Diagnosis not present

## 2021-04-18 DIAGNOSIS — Z0000 Encounter for general adult medical examination without abnormal findings: Secondary | ICD-10-CM | POA: Insufficient documentation

## 2021-04-18 DIAGNOSIS — Z01419 Encounter for gynecological examination (general) (routine) without abnormal findings: Secondary | ICD-10-CM

## 2021-04-18 LAB — WET PREP FOR TRICH, YEAST, CLUE

## 2021-04-18 NOTE — Patient Instructions (Signed)
Health Maintenance, Female Adopting a healthy lifestyle and getting preventive care are important in promoting health and wellness. Ask your health care provider about:  The right schedule for you to have regular tests and exams.  Things you can do on your own to prevent diseases and keep yourself healthy. What should I know about diet, weight, and exercise? Eat a healthy diet  Eat a diet that includes plenty of vegetables, fruits, low-fat dairy products, and lean protein.  Do not eat a lot of foods that are high in solid fats, added sugars, or sodium.   Maintain a healthy weight Body mass index (BMI) is used to identify weight problems. It estimates body fat based on height and weight. Your health care provider can help determine your BMI and help you achieve or maintain a healthy weight. Get regular exercise Get regular exercise. This is one of the most important things you can do for your health. Most adults should:  Exercise for at least 150 minutes each week. The exercise should increase your heart rate and make you sweat (moderate-intensity exercise).  Do strengthening exercises at least twice a week. This is in addition to the moderate-intensity exercise.  Spend less time sitting. Even light physical activity can be beneficial. Watch cholesterol and blood lipids Have your blood tested for lipids and cholesterol at 38 years of age, then have this test every 5 years. Have your cholesterol levels checked more often if:  Your lipid or cholesterol levels are high.  You are older than 38 years of age.  You are at high risk for heart disease. What should I know about cancer screening? Depending on your health history and family history, you may need to have cancer screening at various ages. This may include screening for:  Breast cancer.  Cervical cancer.  Colorectal cancer.  Skin cancer.  Lung cancer. What should I know about heart disease, diabetes, and high blood  pressure? Blood pressure and heart disease  High blood pressure causes heart disease and increases the risk of stroke. This is more likely to develop in people who have high blood pressure readings, are of African descent, or are overweight.  Have your blood pressure checked: ? Every 3-5 years if you are 18-39 years of age. ? Every year if you are 40 years old or older. Diabetes Have regular diabetes screenings. This checks your fasting blood sugar level. Have the screening done:  Once every three years after age 40 if you are at a normal weight and have a low risk for diabetes.  More often and at a younger age if you are overweight or have a high risk for diabetes. What should I know about preventing infection? Hepatitis B If you have a higher risk for hepatitis B, you should be screened for this virus. Talk with your health care provider to find out if you are at risk for hepatitis B infection. Hepatitis C Testing is recommended for:  Everyone born from 1945 through 1965.  Anyone with known risk factors for hepatitis C. Sexually transmitted infections (STIs)  Get screened for STIs, including gonorrhea and chlamydia, if: ? You are sexually active and are younger than 38 years of age. ? You are older than 38 years of age and your health care provider tells you that you are at risk for this type of infection. ? Your sexual activity has changed since you were last screened, and you are at increased risk for chlamydia or gonorrhea. Ask your health care provider   if you are at risk.  Ask your health care provider about whether you are at high risk for HIV. Your health care provider may recommend a prescription medicine to help prevent HIV infection. If you choose to take medicine to prevent HIV, you should first get tested for HIV. You should then be tested every 3 months for as long as you are taking the medicine. Pregnancy  If you are about to stop having your period (premenopausal) and  you may become pregnant, seek counseling before you get pregnant.  Take 400 to 800 micrograms (mcg) of folic acid every day if you become pregnant.  Ask for birth control (contraception) if you want to prevent pregnancy. Osteoporosis and menopause Osteoporosis is a disease in which the bones lose minerals and strength with aging. This can result in bone fractures. If you are 65 years old or older, or if you are at risk for osteoporosis and fractures, ask your health care provider if you should:  Be screened for bone loss.  Take a calcium or vitamin D supplement to lower your risk of fractures.  Be given hormone replacement therapy (HRT) to treat symptoms of menopause. Follow these instructions at home: Lifestyle  Do not use any products that contain nicotine or tobacco, such as cigarettes, e-cigarettes, and chewing tobacco. If you need help quitting, ask your health care provider.  Do not use street drugs.  Do not share needles.  Ask your health care provider for help if you need support or information about quitting drugs. Alcohol use  Do not drink alcohol if: ? Your health care provider tells you not to drink. ? You are pregnant, may be pregnant, or are planning to become pregnant.  If you drink alcohol: ? Limit how much you use to 0-1 drink a day. ? Limit intake if you are breastfeeding.  Be aware of how much alcohol is in your drink. In the [place] one drink equals one 12 oz bottle of beer (355 mL), one 5 oz glass of wine (148 mL), or one 1 oz glass of hard liquor (44 mL). General instructions  Schedule regular health, dental, and eye exams.  Stay current with your vaccines.  Tell your health care provider if: ? You often feel depressed. ? You have ever been abused or do not feel safe at home. Summary  Adopting a healthy lifestyle and getting preventive care are important in promoting health and wellness.  Follow your health care provider's instructions about healthy  diet, exercising, and getting tested or screened for diseases.  Follow your health care provider's instructions on monitoring your cholesterol and blood pressure. This information is not intended to replace advice given to you by your health care provider. Make sure you discuss any questions you have with your health care provider. Document Revised: 10/26/2018 Document Reviewed: 10/26/2018 Elsevier Patient Education  2021 Elsevier Inc.  

## 2021-04-18 NOTE — Progress Notes (Signed)
40 y.o. G0P0000 Single White or Caucasian female here for annual exam.    Has had a Mirena IUD x 5 years, came in today thinking it was due for removal, but was informed FDA has now approved x 7 years.  No concerns today except has increased amount of discharge. Reports no odor, irritation or pain, just wonders if normal.  Period Pattern:  (no cycle with mirena) No LMP recorded. (Menstrual status: IUD).           Sexually active: Yes.    The current method of family planning is IUD.    Exercising: Yes.    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 Has long term partner, good relationship.   reports that she has never smoked. She has never used smokeless tobacco. She reports previous alcohol use. She reports current drug use. Drug: Marijuana.  Past Medical History:  Diagnosis Date  . Abnormal Pap smear of cervix   . Anxiety   . Depression   . Hyperlipidemia   . Multiple sclerosis (HCC) 2012  . Obesity     Past Surgical History:  Procedure Laterality Date  . BREAST BIOPSY Left   . COLPOSCOPY    . INTRAUTERINE DEVICE (IUD) INSERTION  08/20/2016  . WISDOM TOOTH EXTRACTION N/A 2003    Current Outpatient Medications  Medication Sig Dispense Refill  . modafinil (PROVIGIL) 100 MG tablet Take 1 tablet (100 mg total) by mouth in the morning. (Patient taking differently: Take 100 mg by mouth. Take few times a week) 30 tablet 5  . Ocrelizumab (OCREVUS IV) Inject into the vein.    Marland Kitchen sertraline (ZOLOFT) 100 MG tablet Take 1 tablet (100 mg total) by mouth daily. 90 tablet 3  . VITAMIN D PO Take 4,000 Int'l Units by mouth daily.     No current facility-administered medications for this visit.     Family History  Problem Relation Age of Onset  . Bipolar disorder Mother   . Schizophrenia Mother   . Early death Mother   . Heart attack Mother   . Throat cancer Father   . Early death Father   . Esophageal cancer Father   . Hypertension Father   . Breast cancer Maternal Grandmother   . Breast cancer Paternal Grandmother   . Multiple sclerosis Paternal Grandfather   . Stomach cancer Paternal Grandfather   . Multiple sclerosis Paternal Aunt     Review of Systems  Constitutional: Negative.   HENT: Negative.   Eyes: Negative.   Respiratory: Negative.   Cardiovascular: Negative.   Gastrointestinal: Negative.   Endocrine: Negative.   Genitourinary: Negative.   Musculoskeletal: Negative.   Skin: Negative.   Allergic/Immunologic: Negative.   Neurological: Negative.   Hematological: Negative.   Psychiatric/Behavioral: Negative.     Exam:   BP 94/64   Pulse 70   Resp 16   Ht 5' 3.75" (1.619 m)   Wt 142 lb (64.4 kg)   BMI 24.57 kg/m   Height: 5' 3.75" (161.9 cm)  General appearance: alert, cooperative and appears stated age, no acute distress Head: Normocephalic, without obvious abnormality Neck: no adenopathy, thyroid normal to inspection and palpation Lungs: clear to auscultation bilaterally  Breasts: glandular breast, left breast with small smooth palpable mass, has been evaluated, pt reports not changing Heart: regular rate and rhythm Abdomen: soft, non-tender; no masses,  no organomegaly Extremities: extremities normal, no edema Skin: No rashes or lesions Lymph nodes: Cervical, supraclavicular, and axillary nodes normal. No abnormal inguinal nodes palpated Neurologic: Grossly normal   Pelvic: External genitalia:  no lesions              Urethra:  normal appearing urethra with no masses, tenderness or lesions              Bartholins and Skenes: normal                 Vagina: normal appearing vagina, appropriate for age, moderate amount of yellow appearing  discharge, no lesions, ph 4.0              Cervix: neg cervical motion tenderness, no visible lesions             Bimanual Exam:   Uterus:  normal size, contour, position, consistency, mobility, non-tender              Adnexa: no mass, fullness, tenderness                 Assessment: Well woman exam with routine gynecological exam - Plan: Cytology - PAP( [place])/cotesting Hx MS-managed by neurology  Vaginal discharge - Plan: WET PREP FOR TRICH, YEAST, CLUE  Surveillance of intrauterine contraceptive device- advised FDA now approved IUD for 7 years, due to come out 2024  Leukorrhea- discussed discharge, suspect WBC from IUD string, not likely infections. No treatment necessary but advised may try clindamycin vaginal 2% if desires.  Left breast mass-small, previously evaluated, states not changing. She will continue to monitor.   P:   Pap : collected today  Mammogram: next due at age 35 unless changes  Labs:with PCP  Medications: no new  IUD due out 2024, although recommend check every year  F/U 1 year

## 2021-04-23 LAB — CYTOLOGY - PAP
Comment: NEGATIVE
Diagnosis: NEGATIVE
High risk HPV: NEGATIVE

## 2021-05-12 ENCOUNTER — Other Ambulatory Visit: Payer: Self-pay | Admitting: Neurology

## 2021-06-23 ENCOUNTER — Telehealth: Payer: Self-pay

## 2021-06-23 NOTE — Telephone Encounter (Signed)
I submitted a PA request for modafinil on [facility], Key: [id number] - PA Case ID: [id number].  Awaiting determination.

## 2021-06-23 NOTE — Telephone Encounter (Signed)
the request was approved for the following time period: 06/23/2021 - 06/23/2022

## 2021-07-16 ENCOUNTER — Other Ambulatory Visit: Payer: Self-pay | Admitting: Neurology

## 2021-09-02 IMAGING — MR MR HEAD WO/W CM
14 series · 48 of 48 positions shown · IV contrast (multihance)
Comparison: MRI of the brain March 04, 2008

CLINICAL DATA: Demyelinating disease

EXAM:
MRI HEAD WITHOUT AND WITH CONTRAST
TECHNIQUE: Multiplanar, multiecho pulse sequences of the brain and surrounding
structures were obtained without and with intravenous contrast.
CONTRAST:  13mL MULTIHANCE GADOBENATE DIMEGLUMINE 529 MG/ML IV SOLN

[Series 5: T1 · sagittal · 4.0mm · 0.75mm/px · 3 of 31 slices shown (1 of 3)]
[im 1/31]
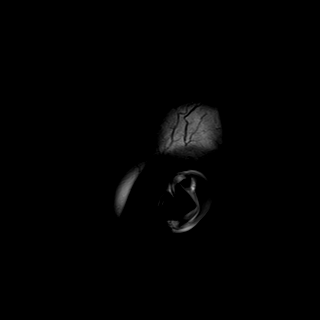
[im 16/31]
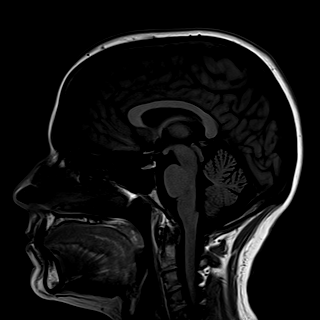
[im 31/31]
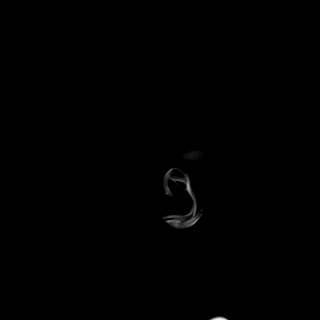

[Series 6: FLAIR · sagittal · 4.0mm · 0.72mm/px · 1 of 26 slices shown (1 of 2)]
[im 1/26]
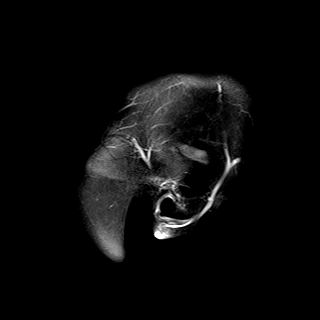

[Series 7: DWI · axial · 3.0mm · 1.44mm/px · z∈[-65,+69]mm · 5 of 84 slices shown (1 of 4)]
[im 1/84]
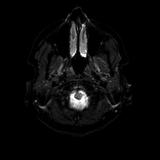
[im 21/84]
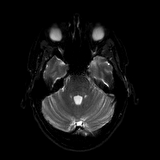
[im 42/84]
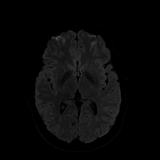
[im 63/84]
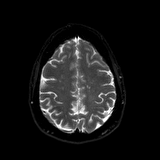
[im 84/84]
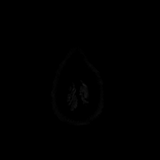

[Series 8: DWI · axial · 3.0mm · 1.44mm/px · z∈[-65,+69]mm · 2 of 42 slices shown (2 of 4)]
[im 1/42]
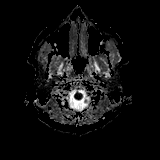
[im 42/42]
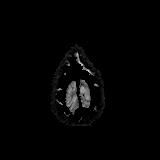

[Series 9: DWI · coronal · 5.0mm · 1.44mm/px · 3 of 60 slices shown (3 of 4)]
[im 1/60]
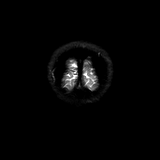
[im 30/60]
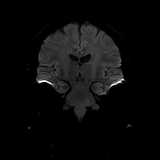
[im 60/60]
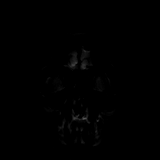

[Series 10: DWI · coronal · 5.0mm · 1.44mm/px · 2 of 30 slices shown (4 of 4)]
[im 1/30]
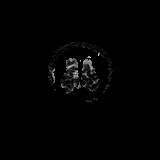
[im 30/30]
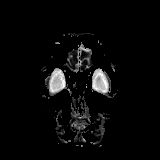

[Series 11: T2 · axial · 4.0mm · 0.36mm/px · z∈[-68,+66]mm · 2 of 27 slices shown (1 of 2)]
[im 1/27]
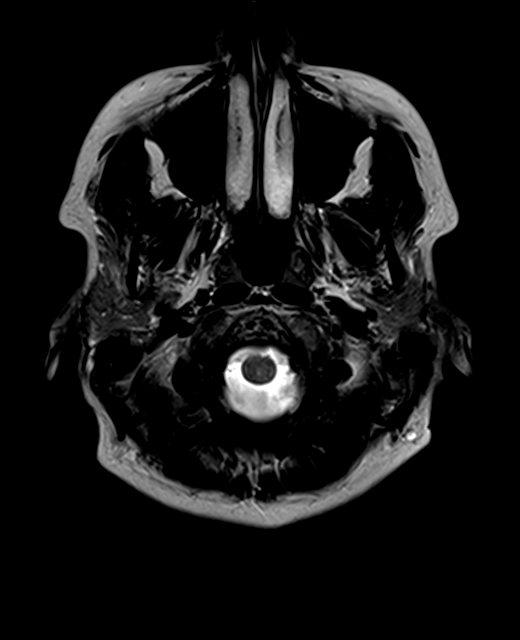
[im 27/27]
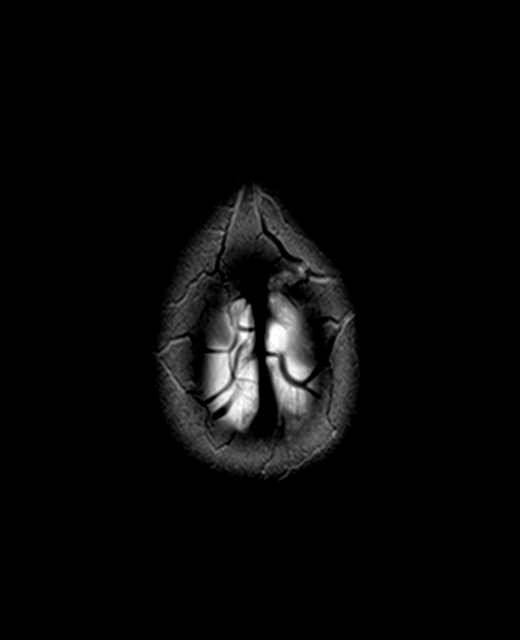

[Series 12: FLAIR · axial · 3.0mm · 0.72mm/px · 1 of 26 slices shown (2 of 2)]
[im 1/26]
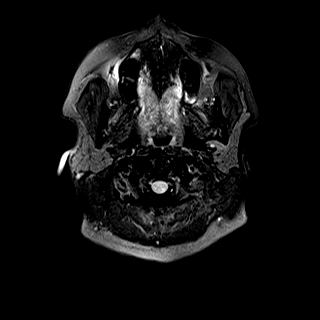

[Series 14: swi_images · axial · 1.5mm · 0.90mm/px · z∈[-72,+70]mm · 5 of 96 slices shown]
[im 1/96]
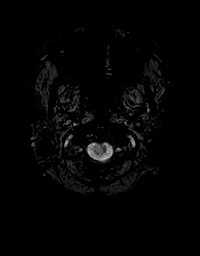
[im 24/96]
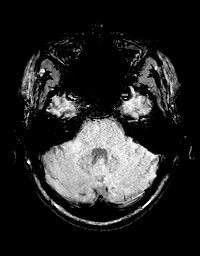
[im 48/96]
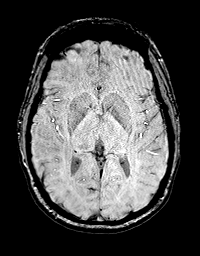
[im 72/96]
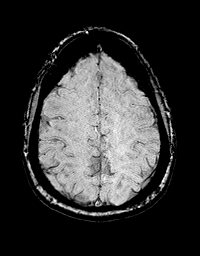
[im 96/96]
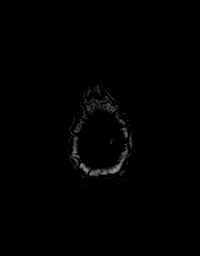

[Series 15: T1 · axial · 1.0mm · 0.94mm/px · z∈[-79,+79]mm · 9 of 160 slices shown (2 of 3)]
[im 1/160]
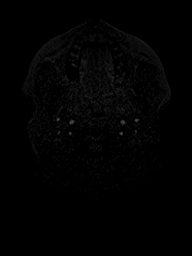
[im 20/160]
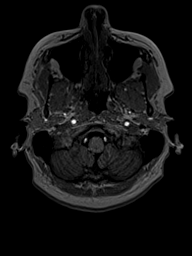
[im 40/160]
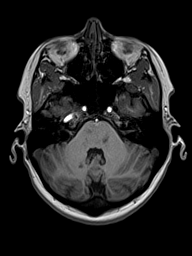
[im 60/160]
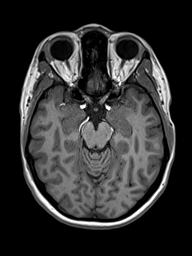
[im 80/160]
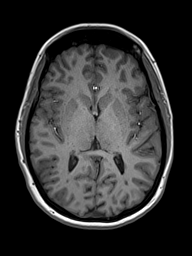
[im 100/160]
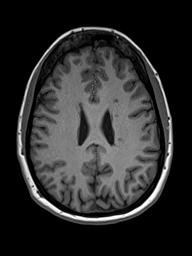
[im 120/160]
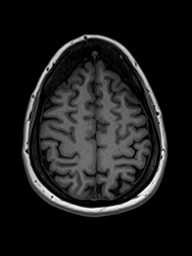
[im 140/160]
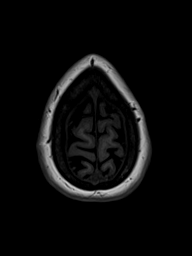
[im 160/160]
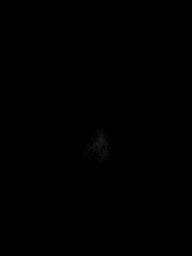

[Series 16: T2 · coronal · 4.0mm · 0.36mm/px · 2 of 34 slices shown (2 of 2)]
[im 1/34]
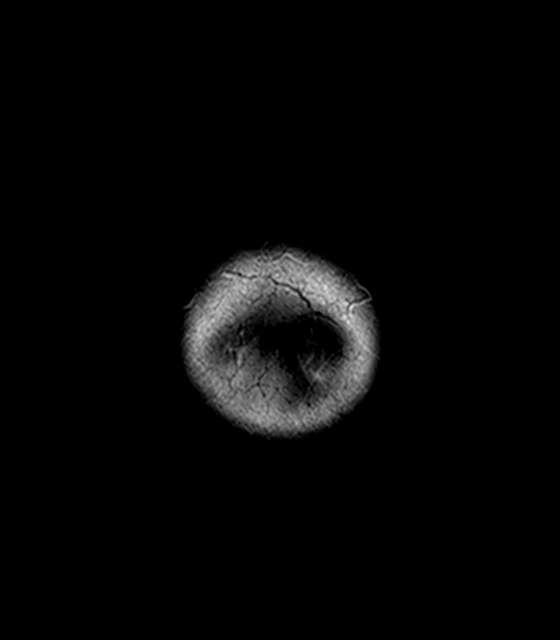
[im 34/34]
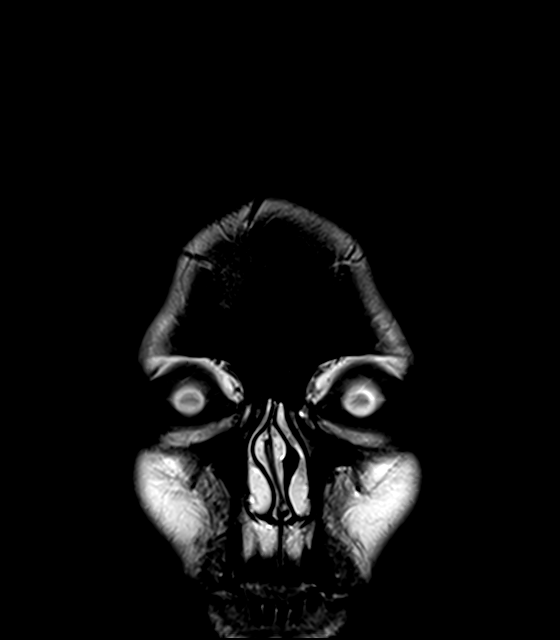

[Series 17: T1 · axial · 1.0mm · 0.94mm/px · z∈[-79,+79]mm · 9 of 160 slices shown (3 of 3)]
[im 1/160]
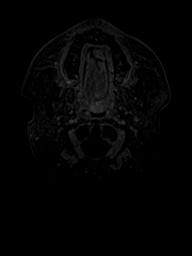
[im 20/160]
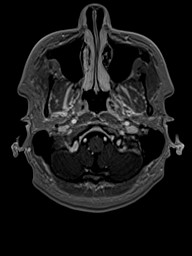
[im 40/160]
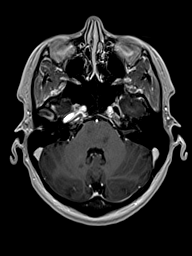
[im 60/160]
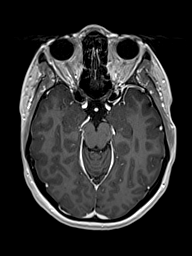
[im 80/160]
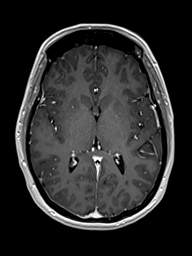
[im 100/160]
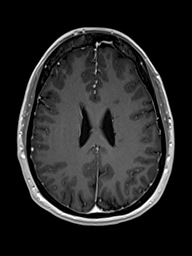
[im 120/160]
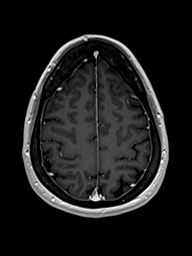
[im 140/160]
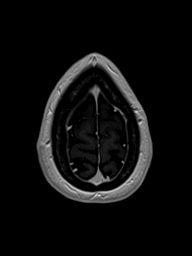
[im 160/160]
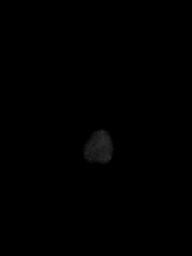

[Series 18: T1 post-contrast · coronal · 4.0mm · 0.72mm/px · 2 of 34 slices shown (1 of 2)]
[im 1/34]
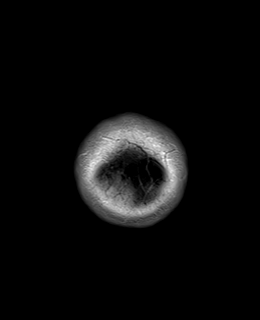
[im 34/34]
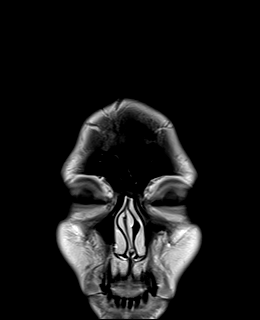

[Series 19: T1 post-contrast · sagittal · 4.0mm · 0.75mm/px · 2 of 31 slices shown (2 of 2)]
[im 1/31]
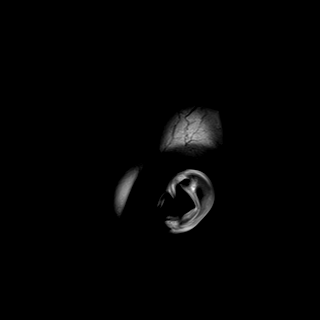
[im 31/31]
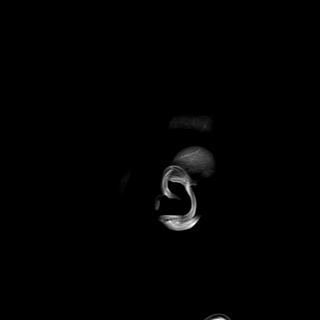

[48 of 48 positions shown; findings below may reference images not displayed]

FINDINGS: Brain: No acute infarction, hemorrhage, hydrocephalus, extra-axial
collection or mass lesion.

Scattered foci of T2 hyperintensity are seen within the white matter
of the cerebral hemispheres, including deep, juxta cortical and
periventricular white matter including corpus callosum, bilaterally
within the pons right superior cerebellar peduncle and right dentate
nucleus. Findings are consistent with the clinical diagnosis of
multiple sclerosis. Several new lesions are seen within the
bilateral frontal lobes. The pontine lesions are also new. However,
none of the lesions show evidence of contrast enhancement to suggest
acute demyelination.

Vascular: Normal flow voids.

Skull and upper cervical spine: Normal marrow signal.

Sinuses/Orbits: Negative.

Other: None.
IMPRESSION: Findings consistent with the clinical diagnosis of multiple
sclerosis. Several new lesions are seen within the bilateral frontal
lobes and within the pons. None of the lesions show evidence of
contrast enhancement to suggest acute demyelination.

## 2021-09-18 ENCOUNTER — Telehealth: Payer: Self-pay | Admitting: Neurology

## 2021-09-18 NOTE — Telephone Encounter (Signed)
LVM & sent mychart msg informing pt of r/s needed 11/28 due to Maralyn Sago being out.

## 2021-10-13 ENCOUNTER — Ambulatory Visit: Payer: BLUE CROSS/BLUE SHIELD | Admitting: Neurology

## 2021-10-20 ENCOUNTER — Telehealth: Payer: Self-pay

## 2021-10-20 NOTE — Telephone Encounter (Signed)
Dr. Willis has retired. Dr. Yan will be the attending provider at [facility]. 

## 2021-12-22 NOTE — Progress Notes (Signed)
PATIENT: Shelby [name] DOB: [birth year]  REASON FOR VISIT: Follow up for MS HISTORY FROM: Patient PRIMARY NEUROLOGIST: Dr.Yan   HISTORY OF PRESENT ILLNESS: Today 12/23/21 Shelby [name] here today for follow-up with history of MS on Ocrevus.  Last infusion was in December 2022.  Is on Provigil for fatigue with good benefit.  Had MRI of the brain and cervical spine in May 2022, at that time MRI of the brain showed a possible small new lesion in the left frontal area.  MRI of the brain in June 2021 showed new lesions, she was on Gilenya at that time. 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 Has urinary urgency. Vitamin D was 26 in May, she increased Vitamin D 4,000 units daily.  Denies numbness or weakness.  HISTORY  03/26/2021 Dr. Anne Hahn: Shelby [name] is a 41 year old left-handed white female with a history of multiple sclerosis.  The patient had been on Gilenya but had an exacerbation with new lesions by MRI, she was switched to Ocrevus, she has tolerated her first dose, she will be getting another dose in the near future.  The patient reports no new symptoms since being on Ocrevus.  She indicates that her balance is doing well, she has had at least a 6 or 57-month history of some urinary urgency and frequency.  She has chronic fatigue but gets good relief with low-dose Provigil.  The patient works from home, she works on Production designer, theatre/television/film in Chief Financial Officer.  She returns to the office today for an evaluation.  There have been no new events of numbness or weakness.  REVIEW OF SYSTEMS: Out of a complete 14 system review of symptoms, the patient complains only of the following symptoms, and all other reviewed systems are negative.  See HPI  ALLERGIES: Allergies  Allergen Reactions   Bactrim [Sulfamethoxazole-Trimethoprim] Anaphylaxis    HOME MEDICATIONS: Outpatient Medications Prior to Visit  Medication Sig Dispense Refill   modafinil  (PROVIGIL) 100 MG tablet TAKE 1 TABLET (100 MG TOTAL) BY MOUTH IN THE MORNING. 30 tablet 5   Ocrelizumab (OCREVUS IV) Inject into the vein.     sertraline (ZOLOFT) 100 MG tablet TAKE 1 TABLET BY MOUTH EVERY DAY 90 tablet 3   VITAMIN D PO Take 4,000 Int'l Units by mouth daily.     No facility-administered medications prior to visit.    PAST MEDICAL HISTORY: Past Medical History:  Diagnosis Date   Abnormal Pap smear of cervix    Anxiety    Depression    Hyperlipidemia    Multiple sclerosis (HCC) 2012   Obesity     PAST SURGICAL HISTORY: Past Surgical History:  Procedure Laterality Date   BREAST BIOPSY Left    COLPOSCOPY     INTRAUTERINE DEVICE (IUD) INSERTION  08/20/2016   WISDOM TOOTH EXTRACTION N/A 2003    FAMILY HISTORY: Family History  Problem Relation Age of Onset   Bipolar disorder Mother    Schizophrenia Mother    Early death Mother    Heart attack Mother    Throat cancer Father    Early death Father    Esophageal cancer Father    Hypertension Father    Breast cancer Maternal Grandmother    Breast cancer Paternal Grandmother    Multiple sclerosis Paternal Grandfather    Stomach cancer Paternal Grandfather    Multiple sclerosis Paternal Aunt     SOCIAL HISTORY: Social History   Socioeconomic History  Marital status: Single    Spouse name: Not on file   Number of children: 0   Years of education: HS   Highest education level: Not on file  Occupational History   Occupation: marketing    Comment: full time  Tobacco Use   Smoking status: Never   Smokeless tobacco: Never  Vaping Use   Vaping Use: Never used  Substance and Sexual Activity   Alcohol use: Not Currently    Alcohol/week: 0.0 standard drinks   Drug use: Yes    Types: Marijuana    Comment: daily   Sexual activity: Yes    Partners: Male    Birth control/protection: I.U.D.    Comment: mirena inserted 08-20-16  Other Topics Concern   Not on file  Social History Narrative   Patient is  left handed.   Patient lives at home with significant other   Patient drinks 5-7 cups caffeine   Social Determinants of Health   Financial Resource Strain: Not on file  Food Insecurity: Not on file  Transportation Needs: Not on file  Physical Activity: Not on file  Stress: Not on file  Social Connections: Not on file  Intimate Partner Violence: Not on file   PHYSICAL EXAM  Vitals:   12/23/21 0941  BP: 117/75  Pulse: 84  Weight: 147 lb (66.7 kg)  Height: 5' 3.5" (1.613 m)   Body mass index is 25.63 kg/m.  Generalized: Well developed, in no acute distress   Neurological examination  Mentation: Alert oriented to time, place, history taking. Follows all commands speech and language fluent Cranial nerve II-XII: Pupils were equal round reactive to light. Extraocular movements were full, visual field were full on confrontational test. Facial sensation and strength were normal. Head turning and shoulder shrug  were normal and symmetric. Motor: The motor testing reveals 5 over 5 strength of all 4 extremities. Good symmetric motor tone is noted throughout.  Sensory: Sensory testing is intact to soft touch on all 4 extremities. No evidence of extinction is noted.  Coordination: Cerebellar testing reveals good finger-nose-finger and heel-to-shin bilaterally.  Gait and station: Gait is normal. Tandem gait is normal. Romberg is negative. No drift is seen.  Reflexes: Deep tendon reflexes are symmetric and normal bilaterally.   DIAGNOSTIC DATA (LABS, IMAGING, TESTING) - I reviewed patient records, labs, notes, testing and imaging myself where available.  Lab Results  Component Value Date   WBC 4.0 03/26/2021   HGB 13.9 03/26/2021   HCT 42.4 03/26/2021   MCV 89 03/26/2021   PLT 241 03/26/2021      Component Value Date/Time   NA 140 03/26/2021 0913   K 4.6 03/26/2021 0913   CL 104 03/26/2021 0913   CO2 23 03/26/2021 0913   GLUCOSE 87 03/26/2021 0913   BUN 9 03/26/2021 0913    CREATININE 0.77 03/26/2021 0913   CALCIUM 9.4 03/26/2021 0913   PROT 6.8 03/26/2021 0913   ALBUMIN 4.7 03/26/2021 0913   AST 12 03/26/2021 0913   ALT 11 03/26/2021 0913   ALKPHOS 84 03/26/2021 0913   BILITOT 0.3 03/26/2021 0913   GFRNONAA 111 10/17/2020 0938   GFRAA 128 10/17/2020 0938   Lab Results  Component Value Date   CHOL 247 (H) 03/21/2021   HDL 60 03/21/2021   LDLCALC 174 (H) 03/21/2021   TRIG 79 03/21/2021   CHOLHDL 4.1 03/21/2021   No results found for: HGBA1C No results found for: VITAMINB12 Lab Results  Component Value Date   TSH 2.500  04/16/2020      ASSESSMENT AND PLAN 41 y.o. year old female   1.  Relapsing remitting multiple sclerosis 2.  Anxiety, depression -Continue Ocrevus, last infusion was in December, tolerates well -Check routine labs to screen for adverse effect of Ocrevus -Plan to repeat MRI of the brain and cervical spine with and without contrast in May 2023 (sent myself a reminder), MRI of the brain in May 2022 showed a new lesion in the left frontal area, not seen on prior MRI in 2021 (2021 MRI showed new lesions, was switched from Gilenya to Ocrevus subsequently) -Increase Zoloft 150 mg daily due to more mood related issues, stress, if there is no benefit, consider switch to Cymbalta -Continue Provigil, reports still has some, will notify when refill needed -Follow-up in 6 months or sooner if needed, will be followed by Dr. Terrace Arabia since Dr. Anne Hahn has retired  Margie Ege, AGNP-C, DNP 12/23/2021, 9:55 AM [facility] [address] [place], [place] [address] [phone]

## 2021-12-23 ENCOUNTER — Encounter: Payer: Self-pay | Admitting: Neurology

## 2021-12-23 ENCOUNTER — Ambulatory Visit: Payer: BLUE CROSS/BLUE SHIELD | Admitting: Neurology

## 2021-12-23 VITALS — BP 117/75 | HR 84 | Ht 63.5 in | Wt 147.0 lb

## 2021-12-23 DIAGNOSIS — R5383 Other fatigue: Secondary | ICD-10-CM

## 2021-12-23 DIAGNOSIS — F418 Other specified anxiety disorders: Secondary | ICD-10-CM

## 2021-12-23 DIAGNOSIS — G35 Multiple sclerosis: Secondary | ICD-10-CM | POA: Diagnosis not present

## 2021-12-23 MED ORDER — SERTRALINE HCL 100 MG PO TABS: 150.0000 mg | ORAL_TABLET | Freq: Every day | ORAL | 6 refills | Status: DC

## 2021-12-23 NOTE — Patient Instructions (Signed)
Plan to check imaging around May 2023 Check labs  Increase Zoloft to 150 mg daily Let me know in 6 weeks if any change with higher dose Zoloft See you back 6 months

## 2021-12-24 ENCOUNTER — Encounter: Payer: Self-pay | Admitting: Neurology

## 2021-12-24 LAB — CBC WITH DIFFERENTIAL/PLATELET
Basophils Absolute: 0.1 10*3/uL (ref 0.0–0.2)
Basos: 1 %
EOS (ABSOLUTE): 0.2 10*3/uL (ref 0.0–0.4)
Eos: 3 %
Hematocrit: 43.3 % (ref 34.0–46.6)
Hemoglobin: 14.5 g/dL (ref 11.1–15.9)
Immature Grans (Abs): 0 10*3/uL (ref 0.0–0.1)
Immature Granulocytes: 0 %
Lymphocytes Absolute: 1.4 10*3/uL (ref 0.7–3.1)
Lymphs: 23 %
MCH: 29.4 pg (ref 26.6–33.0)
MCHC: 33.5 g/dL (ref 31.5–35.7)
MCV: 88 fL (ref 79–97)
Monocytes Absolute: 0.6 10*3/uL (ref 0.1–0.9)
Monocytes: 10 %
Neutrophils Absolute: 3.7 10*3/uL (ref 1.4–7.0)
Neutrophils: 63 %
Platelets: 252 10*3/uL (ref 150–450)
RBC: 4.93 x10E6/uL (ref 3.77–5.28)
RDW: 11.9 % (ref 11.7–15.4)
WBC: 5.9 10*3/uL (ref 3.4–10.8)

## 2021-12-24 LAB — COMPREHENSIVE METABOLIC PANEL
ALT: 9 IU/L (ref 0–32)
AST: 14 IU/L (ref 0–40)
Albumin/Globulin Ratio: 2.8 — ABNORMAL HIGH (ref 1.2–2.2)
Albumin: 5.1 g/dL — ABNORMAL HIGH (ref 3.8–4.8)
Alkaline Phosphatase: 88 IU/L (ref 44–121)
BUN/Creatinine Ratio: 13 (ref 9–23)
BUN: 10 mg/dL (ref 6–20)
Bilirubin Total: 0.4 mg/dL (ref 0.0–1.2)
CO2: 26 mmol/L (ref 20–29)
Calcium: 10.1 mg/dL (ref 8.7–10.2)
Chloride: 105 mmol/L (ref 96–106)
Creatinine, Ser: 0.78 mg/dL (ref 0.57–1.00)
Globulin, Total: 1.8 g/dL (ref 1.5–4.5)
Glucose: 95 mg/dL (ref 70–99)
Potassium: 5.8 mmol/L — ABNORMAL HIGH (ref 3.5–5.2)
Sodium: 144 mmol/L (ref 134–144)
Total Protein: 6.9 g/dL (ref 6.0–8.5)
eGFR: 100 mL/min/{1.73_m2} (ref >59)

## 2021-12-24 LAB — IGG, IGA, IGM
IgA/Immunoglobulin A, Serum: 120 mg/dL (ref 87–352)
IgG (Immunoglobin G), Serum: 851 mg/dL (ref 586–1602)
IgM (Immunoglobulin M), Srm: 72 mg/dL (ref 26–217)

## 2021-12-25 ENCOUNTER — Other Ambulatory Visit: Payer: Self-pay | Admitting: *Deleted

## 2021-12-25 DIAGNOSIS — R799 Abnormal finding of blood chemistry, unspecified: Secondary | ICD-10-CM

## 2021-12-30 ENCOUNTER — Other Ambulatory Visit (INDEPENDENT_AMBULATORY_CARE_PROVIDER_SITE_OTHER): Payer: Self-pay

## 2021-12-30 DIAGNOSIS — Z0289 Encounter for other administrative examinations: Secondary | ICD-10-CM

## 2021-12-30 DIAGNOSIS — R799 Abnormal finding of blood chemistry, unspecified: Secondary | ICD-10-CM

## 2021-12-31 LAB — COMPREHENSIVE METABOLIC PANEL
ALT: 7 IU/L (ref 0–32)
AST: 12 IU/L (ref 0–40)
Albumin/Globulin Ratio: 2.1 (ref 1.2–2.2)
Albumin: 4.5 g/dL (ref 3.8–4.8)
Alkaline Phosphatase: 84 IU/L (ref 44–121)
BUN/Creatinine Ratio: 14 (ref 9–23)
BUN: 9 mg/dL (ref 6–20)
Bilirubin Total: <0.2 mg/dL (ref 0.0–1.2)
CO2: 22 mmol/L (ref 20–29)
Calcium: 9.6 mg/dL (ref 8.7–10.2)
Chloride: 106 mmol/L (ref 96–106)
Creatinine, Ser: 0.64 mg/dL (ref 0.57–1.00)
Globulin, Total: 2.1 g/dL (ref 1.5–4.5)
Glucose: 91 mg/dL (ref 70–99)
Potassium: 4.8 mmol/L (ref 3.5–5.2)
Sodium: 141 mmol/L (ref 134–144)
Total Protein: 6.6 g/dL (ref 6.0–8.5)
eGFR: 116 mL/min/{1.73_m2} (ref >59)

## 2022-01-21 ENCOUNTER — Other Ambulatory Visit: Payer: Self-pay | Admitting: Neurology

## 2022-01-21 NOTE — Telephone Encounter (Signed)
Rx refilled for 90 day supply. 

## 2022-01-30 ENCOUNTER — Encounter: Payer: Self-pay | Admitting: Neurology

## 2022-03-02 ENCOUNTER — Telehealth: Payer: Self-pay | Admitting: Neurology

## 2022-03-02 DIAGNOSIS — G35 Multiple sclerosis: Secondary | ICD-10-CM

## 2022-03-02 NOTE — Telephone Encounter (Signed)
Sent patient My Chart message about time to repeat MRI brain/cervical spine in may 2023. ?

## 2022-03-03 NOTE — Addendum Note (Signed)
Addended by: Glean Salvo on: 03/03/2022 09:08 AM ? ? Modules accepted: Orders ? ?

## 2022-03-12 ENCOUNTER — Telehealth: Payer: Self-pay | Admitting: Neurology

## 2022-03-12 NOTE — Telephone Encounter (Signed)
03/12/22 no auth req via [employer] order sent to GI they will reach out to patient to schedule. [id number] ?

## 2022-03-19 ENCOUNTER — Ambulatory Visit
Admission: RE | Admit: 2022-03-19 | Discharge: 2022-03-19 | Disposition: A | Discharge status: Home / Self Care | Payer: BLUE CROSS/BLUE SHIELD | Source: Ambulatory Visit | Attending: Neurology | Admitting: Neurology

## 2022-03-19 DIAGNOSIS — G35 Multiple sclerosis: Secondary | ICD-10-CM

## 2022-03-19 MED ORDER — GADOBENATE DIMEGLUMINE 529 MG/ML IV SOLN
13.0000 mL | Freq: Once | INTRAVENOUS | Status: AC | PRN
  Administered 2022-03-19: 13 mL via INTRAVENOUS

## 2022-04-22 ENCOUNTER — Telehealth: Payer: Self-pay | Admitting: Neurology

## 2022-04-22 NOTE — Telephone Encounter (Signed)
LVM and sent mychart msg informing pt of r/s needed for 8/8 appt- MD out.

## 2022-06-23 ENCOUNTER — Ambulatory Visit: Payer: BLUE CROSS/BLUE SHIELD | Admitting: Neurology

## 2022-09-30 ENCOUNTER — Encounter: Payer: Self-pay | Admitting: Internal Medicine

## 2023-02-01 ENCOUNTER — Other Ambulatory Visit: Payer: Self-pay | Admitting: Neurology

## 2023-03-04 ENCOUNTER — Other Ambulatory Visit: Payer: Self-pay | Admitting: Neurology

## 2023-03-08 ENCOUNTER — Other Ambulatory Visit: Payer: Self-pay | Admitting: Neurology

## 2023-06-08 ENCOUNTER — Ambulatory Visit: Payer: BLUE CROSS/BLUE SHIELD | Admitting: Neurology

## 2023-06-08 ENCOUNTER — Encounter: Payer: Self-pay | Admitting: Neurology

## 2023-06-08 VITALS — BP 115/74 | HR 79 | Ht 63.0 in | Wt 154.0 lb

## 2023-06-08 DIAGNOSIS — F418 Other specified anxiety disorders: Secondary | ICD-10-CM

## 2023-06-08 DIAGNOSIS — G35 Multiple sclerosis: Secondary | ICD-10-CM

## 2023-06-08 MED ORDER — SERTRALINE HCL 100 MG PO TABS: 100.0000 mg | ORAL_TABLET | Freq: Every day | ORAL | 3 refills | Status: DC

## 2023-06-08 MED ORDER — MODAFINIL 100 MG PO TABS: 100.0000 mg | ORAL_TABLET | Freq: Every morning | ORAL | 5 refills | Status: DC

## 2023-06-08 NOTE — Progress Notes (Signed)
PATIENT: Shelby [name] DOB: [birth year]  REASON FOR VISIT: Follow up for MS HISTORY FROM: Patient PRIMARY NEUROLOGIST: [facility]/Dr.Yan   1.  Relapsing remitting multiple sclerosis 2.  Anxiety, depression  -Doing overall well, continue Ocrevus, next infusion December 2024 -Check MRI of the brain and cervical spine with and without contrast to evaluate subclinical progression -Check CBC with differential, CMP, IgG IgA IgM -Continue Provigil 100 mg daily for fatigue -Continue Zoloft 100 mg daily for mood issues -Follow-up in 6 months with Dr. Terrace Arabia to establish MS care since Dr. 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  MRI of the brain and cervical spine May 2023 overall stable, MS lesions with no change from prior MRI May 2022. Last Ocrevus was Apr 15, 2023. Doing well, 1 month before Ocrevus get numb feeling in her toes. Getting around fine, no falls. Sometimes feels coordination with legs is impaired. Vision is fine, due for eye appointment, urinary urgency. Has Provigil 100 mg PRN, had left over rx, helps with energy and focus. On Zoloft 100 mg daily, tried 150 mg felt dizzy. Mood is better, work stress improving. On Vitamin D.   Update 12/23/21 SS: Shelby [name] here today for follow-up with history of MS on Ocrevus.  Last infusion was in December 2022.  Is on Provigil for fatigue with good benefit.  Had MRI of the brain and cervical spine in May 2022, at that time MRI of the brain showed a possible small new lesion in the left frontal area.  MRI of the brain in June 2021 showed new lesions, she was on Gilenya at that time. 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 Has urinary urgency.  Vitamin D was 26 in May, she increased Vitamin D 4,000 units daily.  Denies numbness or weakness.  HISTORY  03/26/2021 Dr. Anne Hahn: Shelby [name] is a 42 year old left-handed white female with a history of multiple sclerosis.  The patient had been on Gilenya but had an exacerbation with new lesions by MRI, she was switched to Ocrevus, she has tolerated her first dose, she will be getting another dose in the near future.  The patient reports no new symptoms since being on Ocrevus.  She indicates that her balance is doing well, she has had at least a 6 or 55-month history of some urinary urgency and frequency.  She has chronic fatigue but gets good relief with low-dose Provigil.  The patient works from home, she works on Production designer, theatre/television/film in Chief Financial Officer.  She returns to the office today for an evaluation.  There have been no new events of numbness or weakness.  REVIEW OF SYSTEMS: Out of a complete 14 system review of symptoms, the patient complains only of the following symptoms, and all other reviewed systems are negative.  See HPI  ALLERGIES: Allergies  Allergen Reactions   Bactrim [Sulfamethoxazole-Trimethoprim] Anaphylaxis    HOME MEDICATIONS: Outpatient Medications Prior to Visit  Medication Sig Dispense Refill   modafinil (PROVIGIL) 100 MG tablet TAKE 1 TABLET (100 MG TOTAL) BY MOUTH IN THE MORNING. 30 tablet 5   Ocrelizumab (OCREVUS IV) Inject into the vein.     sertraline (ZOLOFT) 100 MG tablet Take 1.5 tablets by mouth daily.  Appointment needed for further refills. 45 tablet 0   VITAMIN D PO Take 4,000 Int'l Units by mouth daily.     No facility-administered medications prior to visit.    PAST MEDICAL HISTORY: Past Medical History:  Diagnosis Date   Abnormal Pap smear of cervix    Anxiety    Depression    Hyperlipidemia    Multiple sclerosis (HCC) 2012   Obesity     PAST SURGICAL HISTORY: Past Surgical History:  Procedure Laterality Date   BREAST BIOPSY Left    COLPOSCOPY      INTRAUTERINE DEVICE (IUD) INSERTION  08/20/2016   WISDOM TOOTH EXTRACTION N/A 2003    FAMILY HISTORY: Family History  Problem Relation Age of Onset   Bipolar disorder Mother    Schizophrenia Mother    Early death Mother    Heart attack Mother    Throat cancer Father    Early death Father    Esophageal cancer Father    Hypertension Father    Breast cancer Maternal Grandmother    Breast cancer Paternal Grandmother    Multiple sclerosis Paternal Grandfather    Stomach cancer Paternal Grandfather    Multiple sclerosis Paternal Aunt     SOCIAL HISTORY: Social History   Socioeconomic History   Marital status: Significant Other    Spouse name: Not on file   Number of children: 0   Years of education: HS   Highest education level: Not on file  Occupational History   Occupation: Chief Financial Officer    Comment: full time  Tobacco Use   Smoking status: Never   Smokeless tobacco: Never  Vaping Use   Vaping status: Never Used  Substance and Sexual Activity   Alcohol use: Not Currently    Alcohol/week: 0.0 standard drinks of alcohol   Drug use: Yes    Types: Marijuana    Comment: daily   Sexual activity: Yes    Partners: Male    Birth control/protection: I.U.D.    Comment: mirena inserted 08-20-16  Other Topics Concern   Not on file  Social History Narrative   Patient is left handed.   Patient lives at home with significant other   Patient drinks 5-7 cups caffeine   Social Determinants of Health   Financial Resource Strain: Not on file  Food Insecurity: Not on file  Transportation Needs: Not on file  Physical Activity: Not on file  Stress: Not on file  Social Connections: Not on file  Intimate Partner Violence: Not on file   PHYSICAL EXAM  Vitals:   06/08/23 0923  BP: 115/74  Pulse: 79  Weight: 154 lb (69.9 kg)  Height: 5\' 3"  (1.6 m)   Body mass index is 27.28 kg/m.  Generalized: Well developed, in no acute distress   Neurological examination  Mentation: Alert  oriented to time, place, history taking. Follows all commands speech and language fluent Cranial nerve II-XII: Pupils were equal round reactive to light. Extraocular movements were full, visual field were full on confrontational test. Facial sensation and strength were normal. Head turning and shoulder shrug  were normal and symmetric. Motor: The motor testing reveals 5 over 5 strength of all 4 extremities. Good symmetric motor tone is noted throughout.  Sensory: Sensory testing is intact to soft touch on all 4 extremities. No evidence of extinction is noted.  Coordination: Cerebellar testing reveals good finger-nose-finger and heel-to-shin bilaterally.  Gait and station: Gait is normal.  Reflexes: Deep tendon reflexes are symmetric and normal bilaterally.   DIAGNOSTIC  DATA (LABS, IMAGING, TESTING) - I reviewed patient records, labs, notes, testing and imaging myself where available.  Lab Results  Component Value Date   WBC 5.9 12/23/2021   HGB 14.5 12/23/2021   HCT 43.3 12/23/2021   MCV 88 12/23/2021   PLT 252 12/23/2021      Component Value Date/Time   NA 141 12/30/2021 0839   K 4.8 12/30/2021 0839   CL 106 12/30/2021 0839   CO2 22 12/30/2021 0839   GLUCOSE 91 12/30/2021 0839   BUN 9 12/30/2021 0839   CREATININE 0.64 12/30/2021 0839   CALCIUM 9.6 12/30/2021 0839   PROT 6.6 12/30/2021 0839   ALBUMIN 4.5 12/30/2021 0839   AST 12 12/30/2021 0839   ALT 7 12/30/2021 0839   ALKPHOS 84 12/30/2021 0839   BILITOT <0.2 12/30/2021 0839   GFRNONAA 111 10/17/2020 0938   GFRAA 128 10/17/2020 0938   Lab Results  Component Value Date   CHOL 247 (H) 03/21/2021   HDL 60 03/21/2021   LDLCALC 174 (H) 03/21/2021   TRIG 79 03/21/2021   CHOLHDL 4.1 03/21/2021   No results found for: "HGBA1C" No results found for: "VITAMINB12" Lab Results  Component Value Date   TSH 2.500 04/16/2020   Margie Ege, AGNP-C, DNP 06/08/2023, 9:45 AM [facility] [address] [place], [place] [address] [phone]

## 2023-06-09 ENCOUNTER — Encounter: Payer: Self-pay | Admitting: Neurology

## 2023-06-09 ENCOUNTER — Ambulatory Visit (INDEPENDENT_AMBULATORY_CARE_PROVIDER_SITE_OTHER): Payer: BLUE CROSS/BLUE SHIELD

## 2023-06-09 DIAGNOSIS — G35 Multiple sclerosis: Secondary | ICD-10-CM | POA: Diagnosis not present

## 2023-06-09 LAB — COMPREHENSIVE METABOLIC PANEL
ALT: 8 IU/L (ref 0–32)
AST: 12 IU/L (ref 0–40)
Albumin: 4.9 g/dL (ref 3.9–4.9)
Alkaline Phosphatase: 80 IU/L (ref 44–121)
BUN/Creatinine Ratio: 11 (ref 9–23)
BUN: 8 mg/dL (ref 6–20)
Bilirubin Total: 0.4 mg/dL (ref 0.0–1.2)
CO2: 23 mmol/L (ref 20–29)
Calcium: 9.8 mg/dL (ref 8.7–10.2)
Chloride: 102 mmol/L (ref 96–106)
Creatinine, Ser: 0.7 mg/dL (ref 0.57–1.00)
Globulin, Total: 2 g/dL (ref 1.5–4.5)
Glucose: 89 mg/dL (ref 70–99)
Potassium: 4.4 mmol/L (ref 3.5–5.2)
Sodium: 140 mmol/L (ref 134–144)
Total Protein: 6.9 g/dL (ref 6.0–8.5)
eGFR: 113 mL/min/{1.73_m2} (ref >59)

## 2023-06-09 LAB — CBC WITH DIFFERENTIAL/PLATELET
Basophils Absolute: 0.1 10*3/uL (ref 0.0–0.2)
Basos: 1 %
EOS (ABSOLUTE): 0.1 10*3/uL (ref 0.0–0.4)
Eos: 2 %
Hematocrit: 45 % (ref 34.0–46.6)
Hemoglobin: 14.5 g/dL (ref 11.1–15.9)
Immature Grans (Abs): 0 10*3/uL (ref 0.0–0.1)
Immature Granulocytes: 0 %
Lymphocytes Absolute: 1.5 10*3/uL (ref 0.7–3.1)
Lymphs: 26 %
MCH: 29.1 pg (ref 26.6–33.0)
MCHC: 32.2 g/dL (ref 31.5–35.7)
MCV: 90 fL (ref 79–97)
Monocytes Absolute: 0.6 10*3/uL (ref 0.1–0.9)
Monocytes: 10 %
Neutrophils Absolute: 3.6 10*3/uL (ref 1.4–7.0)
Neutrophils: 61 %
Platelets: 241 10*3/uL (ref 150–450)
RBC: 4.98 x10E6/uL (ref 3.77–5.28)
RDW: 12.2 % (ref 11.7–15.4)
WBC: 5.9 10*3/uL (ref 3.4–10.8)

## 2023-06-09 LAB — IGG, IGA, IGM
IgA/Immunoglobulin A, Serum: 109 mg/dL (ref 87–352)
IgG (Immunoglobin G), Serum: 822 mg/dL (ref 586–1602)
IgM (Immunoglobulin M), Srm: 63 mg/dL (ref 26–217)

## 2023-06-09 MED ORDER — GADOBENATE DIMEGLUMINE 529 MG/ML IV SOLN
15.0000 mL | Freq: Once | INTRAVENOUS | Status: AC | PRN
  Administered 2023-06-09: 15 mL via INTRAVENOUS

## 2023-06-15 ENCOUNTER — Telehealth: Payer: Self-pay

## 2023-06-15 ENCOUNTER — Other Ambulatory Visit (HOSPITAL_COMMUNITY): Payer: Self-pay

## 2023-06-15 NOTE — Telephone Encounter (Signed)
Pharmacy Patient Advocate Encounter  Received notification from [facility] that Prior Authorization for Modafinil 100MG  tablets has been APPROVED from 06/15/2023 to 06/14/2024. Ran test claim, Copay is $0 per 30DS  PA #/Case ID/Reference #: PA Case ID #: [id number]  Key: [id number]

## 2023-06-22 NOTE — Progress Notes (Signed)
Chart reviewed, agree above plan ?

## 2023-07-01 ENCOUNTER — Telehealth: Payer: Self-pay

## 2023-07-01 NOTE — Telephone Encounter (Signed)
Received ocrevus paperwork via fax that her consent form is about to expire on 08.31.2024. I have called and LVM per DPR to let her know to come to sign. I will leave at front desk. Please attempt to call again when available.  Thanks

## 2023-07-05 NOTE — Telephone Encounter (Signed)
Spoke to pt and she stated that she will come by the office to sign the paperwork for her Ocrevus tomorrow.

## 2023-08-04 ENCOUNTER — Ambulatory Visit: Payer: BLUE CROSS/BLUE SHIELD | Admitting: Neurology

## 2023-08-25 ENCOUNTER — Encounter: Payer: Self-pay | Admitting: Obstetrics and Gynecology

## 2023-08-25 ENCOUNTER — Ambulatory Visit (INDEPENDENT_AMBULATORY_CARE_PROVIDER_SITE_OTHER): Payer: BLUE CROSS/BLUE SHIELD | Admitting: Obstetrics and Gynecology

## 2023-08-25 VITALS — BP 122/74 | HR 71 | Ht 64.5 in | Wt 154.6 lb

## 2023-08-25 DIAGNOSIS — Z975 Presence of (intrauterine) contraceptive device: Secondary | ICD-10-CM

## 2023-08-25 DIAGNOSIS — Z01419 Encounter for gynecological examination (general) (routine) without abnormal findings: Secondary | ICD-10-CM | POA: Diagnosis not present

## 2023-08-25 DIAGNOSIS — Z87898 Personal history of other specified conditions: Secondary | ICD-10-CM

## 2023-08-25 NOTE — Patient Instructions (Signed)

## 2023-08-25 NOTE — Assessment & Plan Note (Signed)
Discussed removal with reinsertion next year.

## 2023-08-25 NOTE — Assessment & Plan Note (Signed)
Cervical cancer screening performed according to ASCCP guidelines. Encouraged annual mammogram screening starting after 40th birthday Labs and immunizations with her primary Encouraged safe sexual practices as indicated Encouraged healthy lifestyle practices with diet and exercise

## 2023-08-25 NOTE — Progress Notes (Signed)
42 y.o. G0P0000 female with MS, Mirena IUD (08/20/2016 insertion), left breat fibroadenoma here for annual exam. SA with partner of 20 years.  No LMP recorded. (Menstrual status: IUD).    Abnormal bleeding: none, minimal with IUD Pelvic discharge or pain: increased discharged with IUD, no odor or itching Breast mass, nipple discharge or skin changes : none Birth control: IUD Last PAP:     Component Value Date/Time   DIAGPAP  04/18/2021 1041    - Negative for intraepithelial lesion or malignancy (NILM)   HPVHIGH Negative 04/18/2021 1041   ADEQPAP  04/18/2021 1041    Satisfactory for evaluation; transformation zone component PRESENT.  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 Clip placed.  OB History  Gravida Para Term Preterm AB Living  0 0 0 0 0 0  SAB IAB Ectopic Multiple Live Births  0 0 0 0 0    Past Medical History:  Diagnosis Date   Abnormal Pap smear of cervix    Anxiety    Depression    Hyperlipidemia    Multiple sclerosis (HCC) 2012   Obesity     Past Surgical History:  Procedure Laterality Date   BREAST BIOPSY Left    COLPOSCOPY     INTRAUTERINE DEVICE (IUD) INSERTION  08/20/2016   WISDOM TOOTH EXTRACTION N/A 2003    Current Outpatient Medications on File Prior to Visit  Medication Sig Dispense Refill   modafinil (PROVIGIL) 100 MG tablet Take 1 tablet (100 mg total) by mouth in the morning. 30 tablet 5   Ocrelizumab (OCREVUS IV) Inject into the vein.     sertraline (ZOLOFT) 100 MG tablet Take 1 tablet (100 mg total) by mouth daily. Take 1.5 tablets by mouth daily. Appointment needed for further refills. 90 tablet 3   VITAMIN D PO Take 4,000 Int'l Units by mouth daily.     No current facility-administered medications on file prior to visit.    Social History   Socioeconomic History   Marital status: Significant Other    Spouse name: Not on file   Number of children: 0   Years of education:  HS   Highest education level: Not on file  Occupational History   Occupation: marketing    Comment: full time  Tobacco Use   Smoking status: Never   Smokeless tobacco: Never  Vaping Use   Vaping status: Never Used  Substance and Sexual Activity   Alcohol use: Not Currently    Alcohol/week: 0.0 standard drinks of alcohol   Drug use: Yes    Types: Marijuana    Comment: daily   Sexual activity: Yes    Partners: Male    Birth control/protection: I.U.D.    Comment: mirena inserted 08-20-16  Other Topics Concern   Not on file  Social History Narrative   Patient is left handed.   Patient lives at home with significant other   Patient drinks 5-7 cups caffeine   Social Determinants of Health   Financial Resource Strain: Not on file  Food Insecurity: Not on file  Transportation Needs: Not on file  Physical Activity: Not on file  Stress: Not on file  Social Connections: Not on file  Intimate Partner Violence: Not on file    Family History  Problem Relation Age of Onset   Bipolar disorder Mother    Schizophrenia Mother    Early death Mother    Heart attack Mother    Throat cancer  Father    Early death Father    Esophageal cancer Father    Hypertension Father    Breast cancer Maternal Grandmother    Breast cancer Paternal Grandmother    Multiple sclerosis Paternal Grandfather    Stomach cancer Paternal Grandfather    Multiple sclerosis Paternal Aunt     Allergies  Allergen Reactions   Bactrim [Sulfamethoxazole-Trimethoprim] Anaphylaxis      PE Today's Vitals   08/25/23 0835  BP: 122/74  Pulse: 71  SpO2: 97%  Weight: 154 lb 9.6 oz (70.1 kg)  Height: 5' 4.5" (1.638 m)   Body mass index is 26.13 kg/m.  Physical Exam Vitals reviewed. Exam conducted with a chaperone present.  Constitutional:      General: She is not in acute distress.    Appearance: Normal appearance.  HENT:     Head: Normocephalic and atraumatic.     Nose: Nose normal.  Eyes:      Extraocular Movements: Extraocular movements intact.     Conjunctiva/sclera: Conjunctivae normal.  Neck:     Thyroid: No thyroid mass, thyromegaly or thyroid tenderness.  Pulmonary:     Effort: Pulmonary effort is normal.  Chest:     Chest wall: No mass or tenderness.  Breasts:    Right: Normal. No swelling, mass, nipple discharge, skin change or tenderness.     Left: Normal. No swelling, mass, nipple discharge, skin change or tenderness.    Abdominal:     General: There is no distension.     Palpations: Abdomen is soft.     Tenderness: There is no abdominal tenderness.  Genitourinary:    General: Normal vulva.     Exam position: Lithotomy position.     Urethra: No prolapse.     Vagina: Normal. No vaginal discharge or bleeding.     Cervix: Normal. No lesion.     Uterus: Normal. Not enlarged and not tender.      Adnexa: Right adnexa normal and left adnexa normal.     Comments: +IUD strings Musculoskeletal:        General: Normal range of motion.     Cervical back: Normal range of motion.  Lymphadenopathy:     Upper Body:     Right upper body: No axillary adenopathy.     Left upper body: No axillary adenopathy.     Lower Body: No right inguinal adenopathy. No left inguinal adenopathy.  Skin:    General: Skin is warm and dry.  Neurological:     General: No focal deficit present.     Mental Status: She is alert.  Psychiatric:        Mood and Affect: Mood normal.        Behavior: Behavior normal.       Assessment and Plan:        Well woman exam with routine gynecological exam Assessment & Plan: Cervical cancer screening performed according to ASCCP guidelines. Encouraged annual mammogram screening starting after 40th birthday Labs and immunizations with her primary Encouraged safe sexual practices as indicated Encouraged healthy lifestyle practices with diet and exercise    IUD (intrauterine device) in place Assessment & Plan: Discussed removal with reinsertion  next year.   History of lump of left breast Assessment & Plan: Continue to monitor with exams Will start screening MMG over next year.     Rosalyn Gess, MD

## 2023-08-25 NOTE — Assessment & Plan Note (Signed)
Continue to monitor with exams Will start screening MMG over next year.

## 2023-12-09 ENCOUNTER — Ambulatory Visit: Payer: BLUE CROSS/BLUE SHIELD | Admitting: Neurology

## 2023-12-09 ENCOUNTER — Encounter: Payer: Self-pay | Admitting: Neurology

## 2023-12-09 VITALS — BP 114/66 | Ht 64.0 in | Wt 155.0 lb

## 2023-12-09 DIAGNOSIS — R3915 Urgency of urination: Secondary | ICD-10-CM

## 2023-12-09 DIAGNOSIS — R5383 Other fatigue: Secondary | ICD-10-CM

## 2023-12-09 DIAGNOSIS — F418 Other specified anxiety disorders: Secondary | ICD-10-CM | POA: Diagnosis not present

## 2023-12-09 DIAGNOSIS — G35 Multiple sclerosis: Secondary | ICD-10-CM

## 2023-12-09 MED ORDER — MODAFINIL 100 MG PO TABS: 100.0000 mg | ORAL_TABLET | Freq: Every morning | ORAL | 5 refills | Status: DC

## 2023-12-09 NOTE — Progress Notes (Signed)
Chief Complaint  Patient presents with   Follow-up    Rm 15, MS, Depression, no changes since last visit, denies SI/HI      ASSESSMENT AND PLAN  Shelby [name] is a 43 y.o. female   Relapsing-remitting multiple sclerosis  Was diagnosed in 2012 after left optic neuritis, abnormal MRI of the brain, and cervical spine, was treated with Gilenya from 2012-2021, then switched to ocrelizumab, doing well,  Laboratory evaluation today  Fatigue  Prevagen 100 mg daily was very helpful, Depression anxiety  Doing well on Zoloft 100 mg daily Urinary urgency  Does not need medication management yet  Return To Clinic With NP In 6 Months   DIAGNOSTIC DATA (LABS, IMAGING, TESTING) - I reviewed patient records, labs, notes, testing and imaging myself where available.   MEDICAL HISTORY:  Shelby [name] is a 43 year old female, follow-up for her relapsing remitting multiple sclerosis, patient of Dr. Anne Hahn since 2015 primary care is nurse practitioner Hetty Blend   History is obtained from the patient and review of electronic medical records. I personally reviewed pertinent available imaging films in PACS.    She was diagnosed with relapsing remitting multiple sclerosis in 2012 at age 26 at [place], following left optic neuritis, which she regained total recovery after IV steroid followed by p.o. tapering,  MRI of the brain showed findings consistent with relapsing remitting multiple sclerosis, was treated with Gilenya since.  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 steroid, MRI of the brain then showed no significant abnormality.  Ever since she was treated with Gilenya, her symptoms was stable, but around 2021, she began to noticed intermittent bilateral feet paresthesia, urinary urgency, will switch to ocrelizumab  since, tolerating it well   She functions well, work full-time a Health and safety inspector job, denies gait abnormality, continue to have urinary urgency, rare urinary accident,  She has depression, well-controlled by Zoloft 100 mg daily, taking Provigil 100 mg as needed to help her fatigue   PHYSICAL EXAM:   Vitals:   12/09/23 0847  BP: 114/66  Weight: 155 lb (70.3 kg)  Height: 5\' 4"  (1.626 m)      Body mass index is 26.61 kg/m.  PHYSICAL EXAMNIATION:  Gen: NAD, conversant, well nourised, well groomed                     Cardiovascular: Regular rate rhythm, no peripheral edema, warm, nontender. Eyes: Conjunctivae clear without exudates or hemorrhage Neck: Supple, no carotid bruits. Pulmonary: Clear to auscultation bilaterally   NEUROLOGICAL EXAM:  MENTAL STATUS: Speech/cognition: Awake, alert, oriented to history taking and casual conversation CRANIAL NERVES: CN II: Visual fields are full to confrontation. Pupils are round equal and briskly reactive to light.  Funduscopy examination were normal bilaterally, OD 20/20 CN III, IV, VI: extraocular movement are normal. No ptosis. CN V: Facial sensation is intact to light touch CN VII: Face is symmetric with normal eye closure  CN VIII: Hearing is normal to causal conversation. CN IX, X: Phonation is normal. CN XI: Head turning and shoulder shrug are intact  MOTOR: There is no pronator drift of out-stretched arms. Muscle bulk and tone are normal. Muscle strength is normal.  REFLEXES: Reflexes are 3 and symmetric at the biceps, triceps, knees, and ankles. Plantar responses are flexor.  SENSORY: Intact to light touch, pinprick and  vibratory sensation are intact in fingers and toes.  COORDINATION: There is no trunk or limb dysmetria noted.  GAIT/STANCE: Posture is normal. Gait is steady with normal steps, base, arm swing, and turning. Heel and toe walking are normal. Tandem gait is normal.  Romberg is absent.  REVIEW OF SYSTEMS:  Full  14 system review of systems performed and notable only for as above All other review of systems were negative.   ALLERGIES: Allergies  Allergen Reactions   Bactrim [Sulfamethoxazole-Trimethoprim] Anaphylaxis    HOME MEDICATIONS: Current Outpatient Medications  Medication Sig Dispense Refill   Ocrelizumab (OCREVUS IV) Inject into the vein.     sertraline (ZOLOFT) 100 MG tablet Take 1 tablet (100 mg total) by mouth daily. Take 1.5 tablets by mouth daily. Appointment needed for further refills. 90 tablet 3   VITAMIN D PO Take 4,000 Int'l Units by mouth daily.     modafinil (PROVIGIL) 100 MG tablet Take 1 tablet (100 mg total) by mouth in the morning. 30 tablet 5   No current facility-administered medications for this visit.    PAST MEDICAL HISTORY: Past Medical History:  Diagnosis Date   Abnormal Pap smear of cervix    Anxiety    Depression    Hyperlipidemia    Multiple sclerosis (HCC) 2012   Obesity     PAST SURGICAL HISTORY: Past Surgical History:  Procedure Laterality Date   BREAST BIOPSY Left    COLPOSCOPY     INTRAUTERINE DEVICE (IUD) INSERTION  08/20/2016   WISDOM TOOTH EXTRACTION N/A 2003    FAMILY HISTORY: Family History  Problem Relation Age of Onset   Bipolar disorder Mother    Schizophrenia Mother    Early death Mother    Heart attack Mother    Throat cancer Father    Early death Father    Esophageal cancer Father    Hypertension Father    Breast cancer Maternal Grandmother    Breast cancer Paternal Grandmother    Multiple sclerosis Paternal Grandfather    Stomach cancer Paternal Grandfather    Multiple sclerosis Paternal Aunt     SOCIAL HISTORY: Social History   Socioeconomic History   Marital status: Significant Other    Spouse name: Not on file   Number of children: 0   Years of education: HS   Highest education level: Not on file  Occupational History   Occupation: Chief Financial Officer    Comment: full time  Tobacco Use   Smoking status: Never    Smokeless tobacco: Never  Vaping Use   Vaping status: Never Used  Substance and Sexual Activity   Alcohol use: Not Currently    Alcohol/week: 0.0 standard drinks of alcohol   Drug use: Yes    Types: Marijuana    Comment: daily   Sexual activity: Yes    Partners: Male    Birth control/protection: I.U.D.    Comment: mirena inserted 08-20-16  Other Topics Concern   Not on file  Social History Narrative   Patient is left handed.   Patient lives at home with significant other   Patient drinks 5-7 cups caffeine   Works in Chief Financial Officer    Social Drivers of Corporate investment banker Strain: Not on [employer] Insecurity: Not on file  Transportation Needs: Not on file  Physical Activity: Not on file  Stress: Not on file  Social Connections: Not on file  Intimate Partner Violence: Not on file      Levert Feinstein, M.D. Ph.D.  [facility] [address] [place], [place] [address] Ph: [phone] Fax: [phone]  CC:  Avanell Shackleton, NP-C [address] [place],  [place] [address]  Avanell Shackleton, NP-C

## 2023-12-10 ENCOUNTER — Other Ambulatory Visit: Payer: Self-pay | Admitting: Neurology

## 2023-12-10 LAB — IGG, IGA, IGM
IgA/Immunoglobulin A, Serum: 110 mg/dL (ref 87–352)
IgG (Immunoglobin G), Serum: 834 mg/dL (ref 586–1602)
IgM (Immunoglobulin M), Srm: 64 mg/dL (ref 26–217)

## 2023-12-10 LAB — COMPREHENSIVE METABOLIC PANEL
ALT: 9 [IU]/L (ref 0–32)
AST: 15 [IU]/L (ref 0–40)
Albumin: 4.7 g/dL (ref 3.9–4.9)
Alkaline Phosphatase: 99 [IU]/L (ref 44–121)
BUN/Creatinine Ratio: 17 (ref 9–23)
BUN: 12 mg/dL (ref 6–24)
Bilirubin Total: 0.2 mg/dL (ref 0.0–1.2)
CO2: 22 mmol/L (ref 20–29)
Calcium: 9.4 mg/dL (ref 8.7–10.2)
Chloride: 104 mmol/L (ref 96–106)
Creatinine, Ser: 0.69 mg/dL (ref 0.57–1.00)
Globulin, Total: 2.3 g/dL (ref 1.5–4.5)
Glucose: 90 mg/dL (ref 70–99)
Potassium: 4.3 mmol/L (ref 3.5–5.2)
Sodium: 142 mmol/L (ref 134–144)
Total Protein: 7 g/dL (ref 6.0–8.5)
eGFR: 112 mL/min/{1.73_m2} (ref >59)

## 2023-12-10 LAB — TSH: TSH: 2.41 u[IU]/mL (ref 0.450–4.500)

## 2023-12-10 LAB — CBC WITH DIFFERENTIAL/PLATELET
Basophils Absolute: 0.1 10*3/uL (ref 0.0–0.2)
Basos: 1 %
EOS (ABSOLUTE): 0.1 10*3/uL (ref 0.0–0.4)
Eos: 2 %
Hematocrit: 43.4 % (ref 34.0–46.6)
Hemoglobin: 14.2 g/dL (ref 11.1–15.9)
Immature Grans (Abs): 0 10*3/uL (ref 0.0–0.1)
Immature Granulocytes: 1 %
Lymphocytes Absolute: 1.5 10*3/uL (ref 0.7–3.1)
Lymphs: 22 %
MCH: 29.3 pg (ref 26.6–33.0)
MCHC: 32.7 g/dL (ref 31.5–35.7)
MCV: 90 fL (ref 79–97)
Monocytes Absolute: 0.5 10*3/uL (ref 0.1–0.9)
Monocytes: 8 %
Neutrophils Absolute: 4.4 10*3/uL (ref 1.4–7.0)
Neutrophils: 66 %
Platelets: 249 10*3/uL (ref 150–450)
RBC: 4.85 x10E6/uL (ref 3.77–5.28)
RDW: 12 % (ref 11.7–15.4)
WBC: 6.6 10*3/uL (ref 3.4–10.8)

## 2023-12-10 LAB — VITAMIN D 25 HYDROXY (VIT D DEFICIENCY, FRACTURES): Vit D, 25-Hydroxy: 25.8 ng/mL — ABNORMAL LOW (ref 30.0–100.0)

## 2023-12-13 ENCOUNTER — Encounter: Payer: Self-pay | Admitting: Neurology

## 2024-02-02 ENCOUNTER — Telehealth: Payer: Self-pay | Admitting: Neurology

## 2024-02-02 NOTE — Telephone Encounter (Signed)
 LVM and sent mychart msg informing pt of r/s needed for 8/7 appt- Maralyn Sago out.

## 2024-06-07 ENCOUNTER — Other Ambulatory Visit: Payer: Self-pay | Admitting: Neurology

## 2024-06-07 NOTE — Telephone Encounter (Signed)
 Requested Prescriptions   Pending Prescriptions Disp Refills   modafinil  (PROVIGIL ) 100 MG tablet [Pharmacy Med Name: MODAFINIL  100 MG TABLET] 30 tablet     Sig: TAKE 1 TABLET (100 MG TOTAL) BY MOUTH IN THE MORNING   Last seen 12/09/23, next appt not scheduled and was due in August.   Dispenses    Dispensed Days Supply Quantity Provider Pharmacy  MODAFINIL  100 MG TABLET 03/27/2024 30 30 each Yan, Yijun, MD [facility] [phone] - [facility]...  MODAFINIL  100 MG TABLET 02/18/2024 30 30 each Onita Duos, MD [facility] [phone] - G...  MODAFINIL  100 MG TABLET 12/09/2023 30 30 each Onita Duos, MD [facility] [phone] - [facility]...  MODAFINIL  100 MG TABLET 10/13/2023 30 30 each Gayland Lauraine PARAS, NP [facility] [phone] - G...  MODAFINIL  100 MG TABLET 09/08/2023 30 30 each Gayland Lauraine PARAS, NP [facility] [phone] - G...  MODAFINIL  100 MG TABLET 08/03/2023 30 30 each Gayland Lauraine PARAS, NP [facility] [phone] - G...  MODAFINIL  100 MG TABLET 07/01/2023 30 30 each Gayland Lauraine PARAS, NP [facility] [phone] - G...        PHONE ROOM: PLEASE SCHEDULE PT APPOINTMENT IN ORDER TO RECEIVE REFILL

## 2024-06-12 NOTE — Telephone Encounter (Signed)
 Called and LVM for pt to call back and r/s this appt.

## 2024-06-14 NOTE — Telephone Encounter (Signed)
 Left voicemail patient to call the office to schedule appointment

## 2024-06-22 ENCOUNTER — Ambulatory Visit: Payer: BLUE CROSS/BLUE SHIELD | Admitting: Neurology

## 2024-07-05 NOTE — Telephone Encounter (Signed)
Called and LVM for pt to call back and schedule an appt.

## 2024-07-06 NOTE — Telephone Encounter (Signed)
 Called and LVM for pt to call and schedule a Med check appt. Left detailed message and office number on VM ok to do per DPR.

## 2024-07-07 NOTE — Telephone Encounter (Signed)
 3rd attempt to reach the pt to schedule a f/u appt. LVM for pt to call back.

## 2024-08-21 ENCOUNTER — Ambulatory Visit: Admitting: Neurology

## 2024-08-25 ENCOUNTER — Ambulatory Visit: Payer: BLUE CROSS/BLUE SHIELD | Admitting: Obstetrics and Gynecology

## 2024-09-10 ENCOUNTER — Other Ambulatory Visit: Payer: Self-pay | Admitting: Neurology

## 2024-09-19 ENCOUNTER — Encounter: Payer: Self-pay | Admitting: Neurology

## 2024-09-19 ENCOUNTER — Ambulatory Visit: Admitting: Neurology

## 2024-09-19 VITALS — BP 126/66 | HR 67 | Ht 64.0 in | Wt 156.4 lb

## 2024-09-19 DIAGNOSIS — G35A Relapsing-remitting multiple sclerosis: Secondary | ICD-10-CM | POA: Diagnosis not present

## 2024-09-19 NOTE — Patient Instructions (Signed)
 Great to see you today.  Check labs.  Consider MRI of the brain and cervical spine in 2026.  Continue exercise, vitamin D  supplement.  Call for worsening symptoms.  Follow-up in 6 months.  Thanks!!

## 2024-09-19 NOTE — Progress Notes (Signed)
 Chief Complaint  Patient presents with   Multiple Sclerosis    Rm17, alone,  Ms: fatigue, bilateral foot numbness, urinary urgency,    ASSESSMENT AND PLAN  Shelby [name] is a 43 y.o. female   1.  Relapsing-remitting multiple sclerosis  Was diagnosed in 2012 after left optic neuritis, abnormal MRI of the brain, and cervical spine, was treated with Gilenya  from 2012-2021, then switched to ocrelizumab,   MS remains stable, check labs today  Consider repeat MRI of the brain and cervical spine in 2026  2.  Fatigue  Continue Provigil  100 mg daily  3.  Depression anxiety  Continue Zoloft  150 mg daily  4.  Urinary urgency  Not on medication management at this point, managing with lifestyle modification  Follow-up with me in 6 months  DIAGNOSTIC DATA (LABS, IMAGING, TESTING) - I reviewed patient records, labs, notes, testing and imaging myself where available.   MEDICAL HISTORY:  Shelby [name] is a 43 year old female, follow-up for her relapsing remitting multiple sclerosis, patient of Dr. Jenel since 2015 primary care is nurse practitioner Lendia Nordmann   History is obtained from the patient and review of electronic medical records. I personally reviewed pertinent available imaging films in PACS.    She was diagnosed with relapsing remitting multiple sclerosis in 2012 at age 6 at [place] , following left optic neuritis, which she regained total recovery after IV steroid followed by p.o. tapering,  MRI of the brain showed findings consistent with relapsing remitting multiple sclerosis, was treated with Gilenya  since.  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 steroid, MRI of the brain then showed no significant abnormality.  Ever since she was treated with Gilenya , her symptoms was stable, but around 2021,  she began to noticed intermittent bilateral feet paresthesia, urinary urgency, will switch to ocrelizumab since, tolerating it well  She functions well, work full-time a health and safety inspector job, denies gait abnormality, continue to have urinary urgency, rare urinary accident,  She has depression, well-controlled by Zoloft  100 mg daily, taking Provigil  100 mg as needed to help her fatigue  Update 09/19/24 SS: Labs last visit normal IGG IGM IGA, TSH 2.410, CBC/CMP normal, Vitamin D  25.8. on Vitamin D  supplement. Provigil  100 mg during the week, helps with fatigue. Depression well managed with 150 mg Zoloft . 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 Vision is fine, bladder urgency, gait is okay, not lifting leg up as high with animal gate. Next infusion is 10/19/24 for Ocrevus.   PHYSICAL EXAM:   Vitals:   09/19/24 0942  BP: 126/66  Pulse: 67  Weight: 156 lb 6.4 oz (70.9 kg)  Height: 5' 4 (1.626 m)   Body mass index is 26.85 kg/m.  Physical Exam  General: The patient is alert and cooperative at the time of the examination.  Skin: No significant peripheral edema is noted.  Neurologic Exam  Mental status: The patient is alert and oriented x 3 at the time of the examination. The patient has apparent normal recent and remote memory, with an apparently normal attention span and concentration ability.  Cranial nerves: Facial symmetry is present. Speech is normal, no aphasia or dysarthria is noted. Extraocular movements are full. Visual fields are full.  Motor: The patient has good strength in all  4 extremities.  Sensory examination: Soft touch sensation is symmetric on the face, arms, and legs.  Coordination: The patient has good finger-nose-finger and heel-to-shin bilaterally.  Gait and station: The patient has a normal gait.   Reflexes: Deep tendon reflexes are symmetric but increased   REVIEW OF SYSTEMS:  Full 14 system  review of systems performed and notable only for as above All other review of systems were negative.  ALLERGIES: Allergies  Allergen Reactions   Bactrim [Sulfamethoxazole-Trimethoprim] Anaphylaxis    HOME MEDICATIONS: Current Outpatient Medications  Medication Sig Dispense Refill   modafinil  (PROVIGIL ) 100 MG tablet TAKE 1 TABLET (100 MG TOTAL) BY MOUTH IN THE MORNING 30 tablet 5   Ocrelizumab (OCREVUS IV) Inject into the vein.     sertraline  (ZOLOFT ) 100 MG tablet Take 1.5 tablets (150 mg total) by mouth daily. 135 tablet 0   VITAMIN D  PO Take 4,000 Int'l Units by mouth daily.     No current facility-administered medications for this visit.    PAST MEDICAL HISTORY: Past Medical History:  Diagnosis Date   Abnormal Pap smear of cervix    Anxiety    Depression    Hyperlipidemia    Multiple sclerosis 2012   Obesity     PAST SURGICAL HISTORY: Past Surgical History:  Procedure Laterality Date   BREAST BIOPSY Left    COLPOSCOPY     INTRAUTERINE DEVICE (IUD) INSERTION  08/20/2016   WISDOM TOOTH EXTRACTION N/A 2003    FAMILY HISTORY: Family History  Problem Relation Age of Onset   Bipolar disorder Mother    Schizophrenia Mother    Early death Mother    Heart attack Mother    Throat cancer Father    Early death Father    Esophageal cancer Father    Hypertension Father    Breast cancer Maternal Grandmother    Breast cancer Paternal Grandmother    Multiple sclerosis Paternal Grandfather    Stomach cancer Paternal Grandfather    Multiple sclerosis Paternal Aunt     SOCIAL HISTORY: Social History   Socioeconomic History   Marital status: Significant Other    Spouse name: Not on file   Number of children: 0   Years of education: HS   Highest education level: Not on file  Occupational History   Occupation: chief financial officer    Comment: full time  Tobacco Use   Smoking status: Never   Smokeless tobacco: Never  Vaping Use   Vaping status: Never Used  Substance and  Sexual Activity   Alcohol use: Not Currently    Alcohol/week: 0.0 standard drinks of alcohol   Drug use: Yes    Types: Marijuana    Comment: daily   Sexual activity: Yes    Partners: Male    Birth control/protection: I.U.D.    Comment: mirena  inserted 08-20-16  Other Topics Concern   Not on file  Social History Narrative   Patient is left handed.   Patient lives at home with significant other   Patient drinks 5-7 cups caffeine   Works in chief financial officer    Social Drivers of Corporate Investment Banker Strain: Not on file  Food Insecurity: Not on file  Transportation Needs: Not on file  Physical Activity: Not on file  Stress: Not on file  Social Connections: Not on file  Intimate Partner Violence: Not on file   Lauraine Born, SCHARLENE, DNP  [facility] [address] [place], [place] [address] [phone]

## 2024-09-20 ENCOUNTER — Ambulatory Visit: Payer: Self-pay | Admitting: Neurology

## 2024-09-20 LAB — CBC WITH DIFFERENTIAL/PLATELET
Basophils Absolute: 0.1 x10E3/uL (ref 0.0–0.2)
Basos: 1 %
EOS (ABSOLUTE): 0.1 x10E3/uL (ref 0.0–0.4)
Eos: 2 %
Hematocrit: 45.9 % (ref 34.0–46.6)
Hemoglobin: 14.5 g/dL (ref 11.1–15.9)
Immature Grans (Abs): 0 x10E3/uL (ref 0.0–0.1)
Immature Granulocytes: 0 %
Lymphocytes Absolute: 1.8 x10E3/uL (ref 0.7–3.1)
Lymphs: 26 %
MCH: 29.7 pg (ref 26.6–33.0)
MCHC: 31.6 g/dL (ref 31.5–35.7)
MCV: 94 fL (ref 79–97)
Monocytes Absolute: 0.5 x10E3/uL (ref 0.1–0.9)
Monocytes: 7 %
Neutrophils Absolute: 4.4 x10E3/uL (ref 1.4–7.0)
Neutrophils: 64 %
Platelets: 294 x10E3/uL (ref 150–450)
RBC: 4.89 x10E6/uL (ref 3.77–5.28)
RDW: 11.8 % (ref 11.7–15.4)
WBC: 7 x10E3/uL (ref 3.4–10.8)

## 2024-09-20 LAB — COMPREHENSIVE METABOLIC PANEL WITH GFR
ALT: 18 IU/L (ref 0–32)
AST: 20 IU/L (ref 0–40)
Albumin: 4.5 g/dL (ref 3.9–4.9)
Alkaline Phosphatase: 96 IU/L (ref 41–116)
BUN/Creatinine Ratio: 10 (ref 9–23)
BUN: 8 mg/dL (ref 6–24)
Bilirubin Total: 0.3 mg/dL (ref 0.0–1.2)
CO2: 25 mmol/L (ref 20–29)
Calcium: 9.4 mg/dL (ref 8.7–10.2)
Chloride: 104 mmol/L (ref 96–106)
Creatinine, Ser: 0.77 mg/dL (ref 0.57–1.00)
Globulin, Total: 2.4 g/dL (ref 1.5–4.5)
Glucose: 90 mg/dL (ref 70–99)
Potassium: 4.6 mmol/L (ref 3.5–5.2)
Sodium: 142 mmol/L (ref 134–144)
Total Protein: 6.9 g/dL (ref 6.0–8.5)
eGFR: 100 mL/min/1.73 (ref >59)

## 2024-09-20 LAB — IGG, IGA, IGM
IgA/Immunoglobulin A, Serum: 101 mg/dL (ref 87–352)
IgG (Immunoglobin G), Serum: 772 mg/dL (ref 586–1602)
IgM (Immunoglobulin M), Srm: 59 mg/dL (ref 26–217)

## 2024-09-20 LAB — HEPATITIS B CORE ANTIBODY, TOTAL: Hep B Core Total Ab: NEGATIVE

## 2024-09-20 LAB — HEPATITIS B SURFACE ANTIGEN: Hepatitis B Surface Ag: NEGATIVE

## 2024-11-17 ENCOUNTER — Ambulatory Visit: Admitting: Obstetrics and Gynecology

## 2024-11-17 ENCOUNTER — Encounter: Payer: Self-pay | Admitting: Obstetrics and Gynecology

## 2024-11-17 VITALS — BP 104/72 | HR 80 | Temp 98.1°F | Ht 64.25 in | Wt 155.0 lb

## 2024-11-17 DIAGNOSIS — Z3009 Encounter for other general counseling and advice on contraception: Secondary | ICD-10-CM

## 2024-11-17 DIAGNOSIS — Z1331 Encounter for screening for depression: Secondary | ICD-10-CM | POA: Diagnosis not present

## 2024-11-17 DIAGNOSIS — Z30432 Encounter for removal of intrauterine contraceptive device: Secondary | ICD-10-CM

## 2024-11-17 DIAGNOSIS — Z01419 Encounter for gynecological examination (general) (routine) without abnormal findings: Secondary | ICD-10-CM

## 2024-11-17 MED ORDER — NORETHINDRONE 0.35 MG PO TABS: 1.0000 | ORAL_TABLET | Freq: Every day | ORAL | 4 refills | Status: AC

## 2024-11-17 NOTE — Patient Instructions (Addendum)
 Start the pill immediately and use condoms for backup pregnancy prevention during the first week that you start the pill. Take your pill at the same day every time.  If you forget to take your pill, take it as soon as you remember. If you miss 1 pill, you can take 2 pills on the following day.  Do not take more than 2 pills on the same day. If you think you could be pregnant while taking the birth control pill, take a pregnancy test.  Side effects include break through bleeding, mood changes, weight changes, headache, breast tenderness, nausea, and bloating.  Warning signs include vision changes and leg swelling.  Please call our office if you experience any of the symptoms.   Health Maintenance, Female Adopting a healthy lifestyle and getting preventive care are important in promoting health and wellness. Ask your health care provider about: The right schedule for you to have regular tests and exams. Things you can do on your own to prevent diseases and keep yourself healthy. What should I know about diet, weight, and exercise? Eat a healthy diet  Eat a diet that includes plenty of vegetables, fruits, low-fat dairy products, and lean protein. Do not eat a lot of foods that are high in solid fats, added sugars, or sodium. Maintain a healthy weight Body mass index (BMI) is used to identify weight problems. It estimates body fat based on height and weight. Your health care provider can help determine your BMI and help you achieve or maintain a healthy weight. Get regular exercise Get regular exercise. This is one of the most important things you can do for your health. Most adults should: Exercise for at least 150 minutes each week. The exercise should increase your heart rate and make you sweat (moderate-intensity exercise). Do strengthening exercises at least twice a week. This is in addition to the moderate-intensity exercise. Spend less time sitting. Even light physical activity can be  beneficial. Watch cholesterol and blood lipids Have your blood tested for lipids and cholesterol at 42 years of age, then have this test every 5 years. Have your cholesterol levels checked more often if: Your lipid or cholesterol levels are high. You are older than 42 years of age. You are at high risk for heart disease. What should I know about cancer screening? Depending on your health history and family history, you may need to have cancer screening at various ages. This may include screening for: Breast cancer. Cervical cancer. Colorectal cancer. Skin cancer. Lung cancer. What should I know about heart disease, diabetes, and high blood pressure? Blood pressure and heart disease High blood pressure causes heart disease and increases the risk of stroke. This is more likely to develop in people who have high blood pressure readings or are overweight. Have your blood pressure checked: Every 3-5 years if you are 60-8 years of age. Every year if you are 84 years old or older. Diabetes Have regular diabetes screenings. This checks your fasting blood sugar level. Have the screening done: Once every three years after age 76 if you are at a normal weight and have a low risk for diabetes. More often and at a younger age if you are overweight or have a high risk for diabetes. What should I know about preventing infection? Hepatitis B If you have a higher risk for hepatitis B, you should be screened for this virus. Talk with your health care provider to find out if you are at risk for hepatitis B infection.  Hepatitis C Testing is recommended for: Everyone born from [birth year] through 1963. Anyone with known risk factors for hepatitis C. Sexually transmitted infections (STIs) Get screened for STIs, including gonorrhea and chlamydia, if: You are sexually active and are younger than 42 years of age. You are older than 42 years of age and your health care provider tells you that you are at risk for  this type of infection. Your sexual activity has changed since you were last screened, and you are at increased risk for chlamydia or gonorrhea. Ask your health care provider if you are at risk. Ask your health care provider about whether you are at high risk for HIV. Your health care provider may recommend a prescription medicine to help prevent HIV infection. If you choose to take medicine to prevent HIV, you should first get tested for HIV. You should then be tested every 3 months for as long as you are taking the medicine. Pregnancy If you are about to stop having your period (premenopausal) and you may become pregnant, seek counseling before you get pregnant. Take 400 to 800 micrograms (mcg) of folic acid every day if you become pregnant. Ask for birth control (contraception) if you want to prevent pregnancy. Osteoporosis and menopause Osteoporosis is a disease in which the bones lose minerals and strength with aging. This can result in bone fractures. If you are 73 years old or older, or if you are at risk for osteoporosis and fractures, ask your health care provider if you should: Be screened for bone loss. Take a calcium or vitamin D  supplement to lower your risk of fractures. Be given hormone replacement therapy (HRT) to treat symptoms of menopause. Follow these instructions at home: Alcohol use Do not drink alcohol if: Your health care provider tells you not to drink. You are pregnant, may be pregnant, or are planning to become pregnant. If you drink alcohol: Limit how much you have to: 0-1 drink a day. Know how much alcohol is in your drink. In the [place] one drink equals one 12 oz bottle of beer (355 mL), one 5 oz glass of wine (148 mL), or one 1 oz glass of hard liquor (44 mL). Lifestyle Do not use any products that contain nicotine or tobacco. These products include cigarettes, chewing tobacco, and vaping devices, such as e-cigarettes. If you need help quitting, ask your health  care provider. Do not use street drugs. Do not share needles. Ask your health care provider for help if you need support or information about quitting drugs. General instructions Schedule regular health, dental, and eye exams. Stay current with your vaccines. Tell your health care provider if: You often feel depressed. You have ever been abused or do not feel safe at home. Summary Adopting a healthy lifestyle and getting preventive care are important in promoting health and wellness. Follow your health care provider's instructions about healthy diet, exercising, and getting tested or screened for diseases. Follow your health care provider's instructions on monitoring your cholesterol and blood pressure. This information is not intended to replace advice given to you by your health care provider. Make sure you discuss any questions you have with your health care provider. Document Revised: 03/24/2021 Document Reviewed: 03/24/2021 Elsevier Patient Education  2024 [employer].

## 2024-11-17 NOTE — Progress Notes (Signed)
 "  44 y.o. G0P0000 female with multiple sclerosis, OAB (follows with neurology) Mirena  IUD (08/20/2016 insertion), left breat fibroadenoma here for annual exam. Married last year! 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 PCP: Lendia Boby CROME, NP-C  No LMP recorded. (Menstrual status: IUD).   She desires to remove IUD and consider alternative. No longer desires to have children.  She has noticed increased vaginal discharge with IUD. No odor or irritation. Urine urgency - followed by neurology. Managing with lifestyle modifications. She was recently laid off but found a new job.  Abnormal bleeding: none Pelvic discharge or pain: increased discharged with IUD, no odor or itching, chronic Breast mass, nipple discharge or skin changes : none Birth control: IUD  Last PAP:     Component Value Date/Time   DIAGPAP  04/18/2021 1041    - Negative for intraepithelial lesion or malignancy (NILM)   HPVHIGH Negative 04/18/2021 1041   ADEQPAP  04/18/2021 1041    Satisfactory for evaluation; transformation zone component PRESENT.  2017 NIL, HPV neg  MMG: 2017 Sexually active: yes with partner of 20 yr  Flowsheet Row Office Visit from 11/17/2024 in [facility]  PHQ-2 Total Score 0   Flowsheet Row Office Visit from 03/21/2021 in [facility]  PHQ-9 Total Score 8   GYN HISTORY: Left breat lump, s/p bx> fibroadenoma. Clip placed.  OB History  Gravida Para Term Preterm AB Living  0 0 0 0 0 0  SAB IAB Ectopic Multiple Live Births  0 0 0 0 0    Past Medical History:  Diagnosis Date   Abnormal Pap smear of cervix    Anxiety    Depression    Hyperlipidemia    Multiple sclerosis 2012   Obesity     Past Surgical History:  Procedure Laterality Date   BREAST BIOPSY Left    COLPOSCOPY     INTRAUTERINE DEVICE (IUD) INSERTION  08/20/2016   WISDOM TOOTH EXTRACTION N/A 2003    Current Outpatient Medications on File Prior to Visit  Medication Sig Dispense  Refill   modafinil  (PROVIGIL ) 100 MG tablet TAKE 1 TABLET (100 MG TOTAL) BY MOUTH IN THE MORNING 30 tablet 5   Ocrelizumab (OCREVUS IV) Inject into the vein.     sertraline  (ZOLOFT ) 100 MG tablet Take 1.5 tablets (150 mg total) by mouth daily. 135 tablet 0   VITAMIN D  PO Take 4,000 Int'l Units by mouth daily.     No current facility-administered medications on file prior to visit.    Social History   Socioeconomic History   Marital status: Significant Other    Spouse name: Not on file   Number of children: 0   Years of education: HS   Highest education level: Not on file  Occupational History   Occupation: marketing    Comment: full time  Tobacco Use   Smoking status: Never   Smokeless tobacco: Never  Vaping Use   Vaping status: Never Used  Substance and Sexual Activity   Alcohol use: Not Currently    Alcohol/week: 0.0 standard drinks of alcohol   Drug use: Yes    Types: Marijuana    Comment: daily   Sexual activity: Yes    Partners: Male    Birth control/protection: I.U.D.    Comment: mirena  inserted 08-20-16  Other Topics Concern   Not on file  Social History Narrative   Patient is left handed.   Patient lives at home with significant other   Patient drinks  5-7 cups caffeine   Works in chief financial officer    Social Drivers of Health   Tobacco Use: Low Risk (11/17/2024)   Patient History    Smoking Tobacco Use: Never    Smokeless Tobacco Use: Never    Passive Exposure: Not on file  Financial Resource Strain: Not on file  Food Insecurity: Not on file  Transportation Needs: Not on file  Physical Activity: Not on file  Stress: Not on file  Social Connections: Not on file  Intimate Partner Violence: Not on file  Depression (PHQ2-9): Low Risk (11/17/2024)   Depression (PHQ2-9)    PHQ-2 Score: 0  Alcohol Screen: Not on file  Housing: Not on file  Utilities: Not on file  Health Literacy: Not on file    Family History  Problem Relation Age of Onset   Bipolar disorder  Mother    Schizophrenia Mother    Early death Mother    Heart attack Mother    Throat cancer Father    Early death Father    Esophageal cancer Father    Hypertension Father    Multiple sclerosis Paternal Aunt    Multiple sclerosis Paternal Uncle    Breast cancer Maternal Grandmother    Breast cancer Paternal Grandmother    Multiple sclerosis Paternal Grandfather    Stomach cancer Paternal Grandfather     Allergies  Allergen Reactions   Bactrim [Sulfamethoxazole-Trimethoprim] Anaphylaxis      PE Today's Vitals   11/17/24 1143  BP: 104/72  Pulse: 80  Temp: 98.1 F (36.7 C)  TempSrc: Oral  SpO2: 98%  Weight: 155 lb (70.3 kg)  Height: 5' 4.25 (1.632 m)    Body mass index is 26.4 kg/m.  Physical Exam Vitals reviewed. Exam conducted with a chaperone present.  Constitutional:      General: She is not in acute distress.    Appearance: Normal appearance.  HENT:     Head: Normocephalic and atraumatic.     Nose: Nose normal.  Eyes:     Extraocular Movements: Extraocular movements intact.     Conjunctiva/sclera: Conjunctivae normal.  Pulmonary:     Effort: Pulmonary effort is normal.  Chest:     Chest wall: No mass or tenderness.  Breasts:    Right: Normal. No swelling, mass, nipple discharge, skin change or tenderness.     Left: Normal. No swelling, mass, nipple discharge, skin change or tenderness.    Abdominal:     General: There is no distension.     Palpations: Abdomen is soft.     Tenderness: There is no abdominal tenderness.  Genitourinary:    General: Normal vulva.     Exam position: Lithotomy position.     Urethra: No prolapse.     Vagina: Normal. No vaginal discharge or bleeding.     Cervix: Normal. No lesion.     Uterus: Normal. Not enlarged and not tender.      Adnexa: Right adnexa normal and left adnexa normal.     Comments: +IUD strings Musculoskeletal:        General: Normal range of motion.  Lymphadenopathy:     Upper Body:     Right  upper body: No axillary adenopathy.     Left upper body: No axillary adenopathy.     Lower Body: No right inguinal adenopathy. No left inguinal adenopathy.  Skin:    General: Skin is warm and dry.  Neurological:     General: No focal deficit present.     Mental Status:  She is alert.  Psychiatric:        Mood and Affect: Mood normal.        Behavior: Behavior normal.     IUD removal procedure: Consent was signed.  Speculum inserted. Cervix visualized, IUD grasps with forceps and removed without difficulty. Pt tolerated procedure well.    Assessment and Plan:        Well woman exam with routine gynecological exam Assessment & Plan: Cervical cancer screening performed according to ASCCP guidelines. Encouraged annual mammogram screening, due Labs and immunizations with her primary Encouraged safe sexual practices as indicated Encouraged healthy lifestyle practices with diet and exercise    Encounter for IUD removal  Encounter for counseling regarding contraception -     Norethindrone; Take 1 tablet (0.35 mg total) by mouth daily.  Dispense: 84 tablet; Refill: 4  Contraceptive options were reviewed, POP, injection, implant, IUDs, vasectomy, and sterilization as indicated. R/b/I/c were reviewed. All questions were answered. Wants to start POP.  Vera LULLA Pa, MD  "

## 2024-11-17 NOTE — Assessment & Plan Note (Signed)
 Cervical cancer screening performed according to ASCCP guidelines. Encouraged annual mammogram screening, due Labs and immunizations with her primary Encouraged safe sexual practices as indicated Encouraged healthy lifestyle practices with diet and exercise

## 2024-12-13 ENCOUNTER — Telehealth: Payer: Self-pay | Admitting: Neurology

## 2024-12-13 NOTE — Telephone Encounter (Signed)
 MYC cxl

## 2024-12-14 ENCOUNTER — Other Ambulatory Visit: Payer: Self-pay | Admitting: Neurology

## 2024-12-14 NOTE — Telephone Encounter (Signed)
 Last seen 09/19/24 No 6 month follow up scheduled

## 2025-04-12 ENCOUNTER — Ambulatory Visit: Admitting: Neurology

## 2025-11-19 ENCOUNTER — Ambulatory Visit: Admitting: Obstetrics and Gynecology
